# Patient Record
Sex: Male | Born: 1945
Health system: Southern US, Community
[De-identification: ages and names within clinical notes are randomized; demographics above are authoritative.]

## PROBLEM LIST (undated history)

## (undated) DIAGNOSIS — R001 Bradycardia, unspecified: Secondary | ICD-10-CM

## (undated) DIAGNOSIS — IMO0002 Reserved for concepts with insufficient information to code with codable children: Secondary | ICD-10-CM

## (undated) DIAGNOSIS — E785 Hyperlipidemia, unspecified: Secondary | ICD-10-CM

## (undated) DIAGNOSIS — Z951 Presence of aortocoronary bypass graft: Secondary | ICD-10-CM

## (undated) DIAGNOSIS — I959 Hypotension, unspecified: Secondary | ICD-10-CM

## (undated) DIAGNOSIS — R7989 Other specified abnormal findings of blood chemistry: Secondary | ICD-10-CM

## (undated) DIAGNOSIS — I2510 Atherosclerotic heart disease of native coronary artery without angina pectoris: Secondary | ICD-10-CM

## (undated) HISTORY — DX: Other specified abnormal findings of blood chemistry: R79.89

## (undated) HISTORY — DX: Reserved for concepts with insufficient information to code with codable children: IMO0002

## (undated) HISTORY — PX: TESTICLE SURGERY: SHX794

## (undated) HISTORY — DX: Hypotension, unspecified: I95.9

## (undated) HISTORY — DX: Bradycardia, unspecified: R00.1

## (undated) HISTORY — DX: Presence of aortocoronary bypass graft: Z95.1

## (undated) HISTORY — DX: Hyperlipidemia, unspecified: E78.5

## (undated) HISTORY — DX: Atherosclerotic heart disease of native coronary artery without angina pectoris: I25.10

---

## 2001-11-20 ENCOUNTER — Ambulatory Visit (HOSPITAL_BASED_OUTPATIENT_CLINIC_OR_DEPARTMENT_OTHER): Admission: RE | Admit: 2001-11-20 | Discharge: 2001-11-20 | Payer: Self-pay | Admitting: Urology

## 2006-01-20 ENCOUNTER — Inpatient Hospital Stay (HOSPITAL_COMMUNITY): Admission: AD | Admit: 2006-01-20 | Discharge: 2006-01-29 | Payer: Self-pay | Admitting: Cardiology

## 2006-01-20 HISTORY — PX: CARDIAC CATHETERIZATION: SHX172

## 2006-01-24 DIAGNOSIS — Z951 Presence of aortocoronary bypass graft: Secondary | ICD-10-CM

## 2006-01-24 HISTORY — DX: Presence of aortocoronary bypass graft: Z95.1

## 2006-01-24 HISTORY — PX: CORONARY ARTERY BYPASS GRAFT: SHX141

## 2006-02-22 ENCOUNTER — Encounter: Admission: RE | Admit: 2006-02-22 | Discharge: 2006-02-22 | Payer: Self-pay | Admitting: Surgery

## 2006-10-21 IMAGING — CR DG CHEST 1V PORT
1 series · 1 of 1 positions shown · non-contrast
Comparison: 01/25/06.

CLINICAL DATA: Coronary artery disease. 
PORTABLE CHEST - 1 VIEW:

[view not recorded]
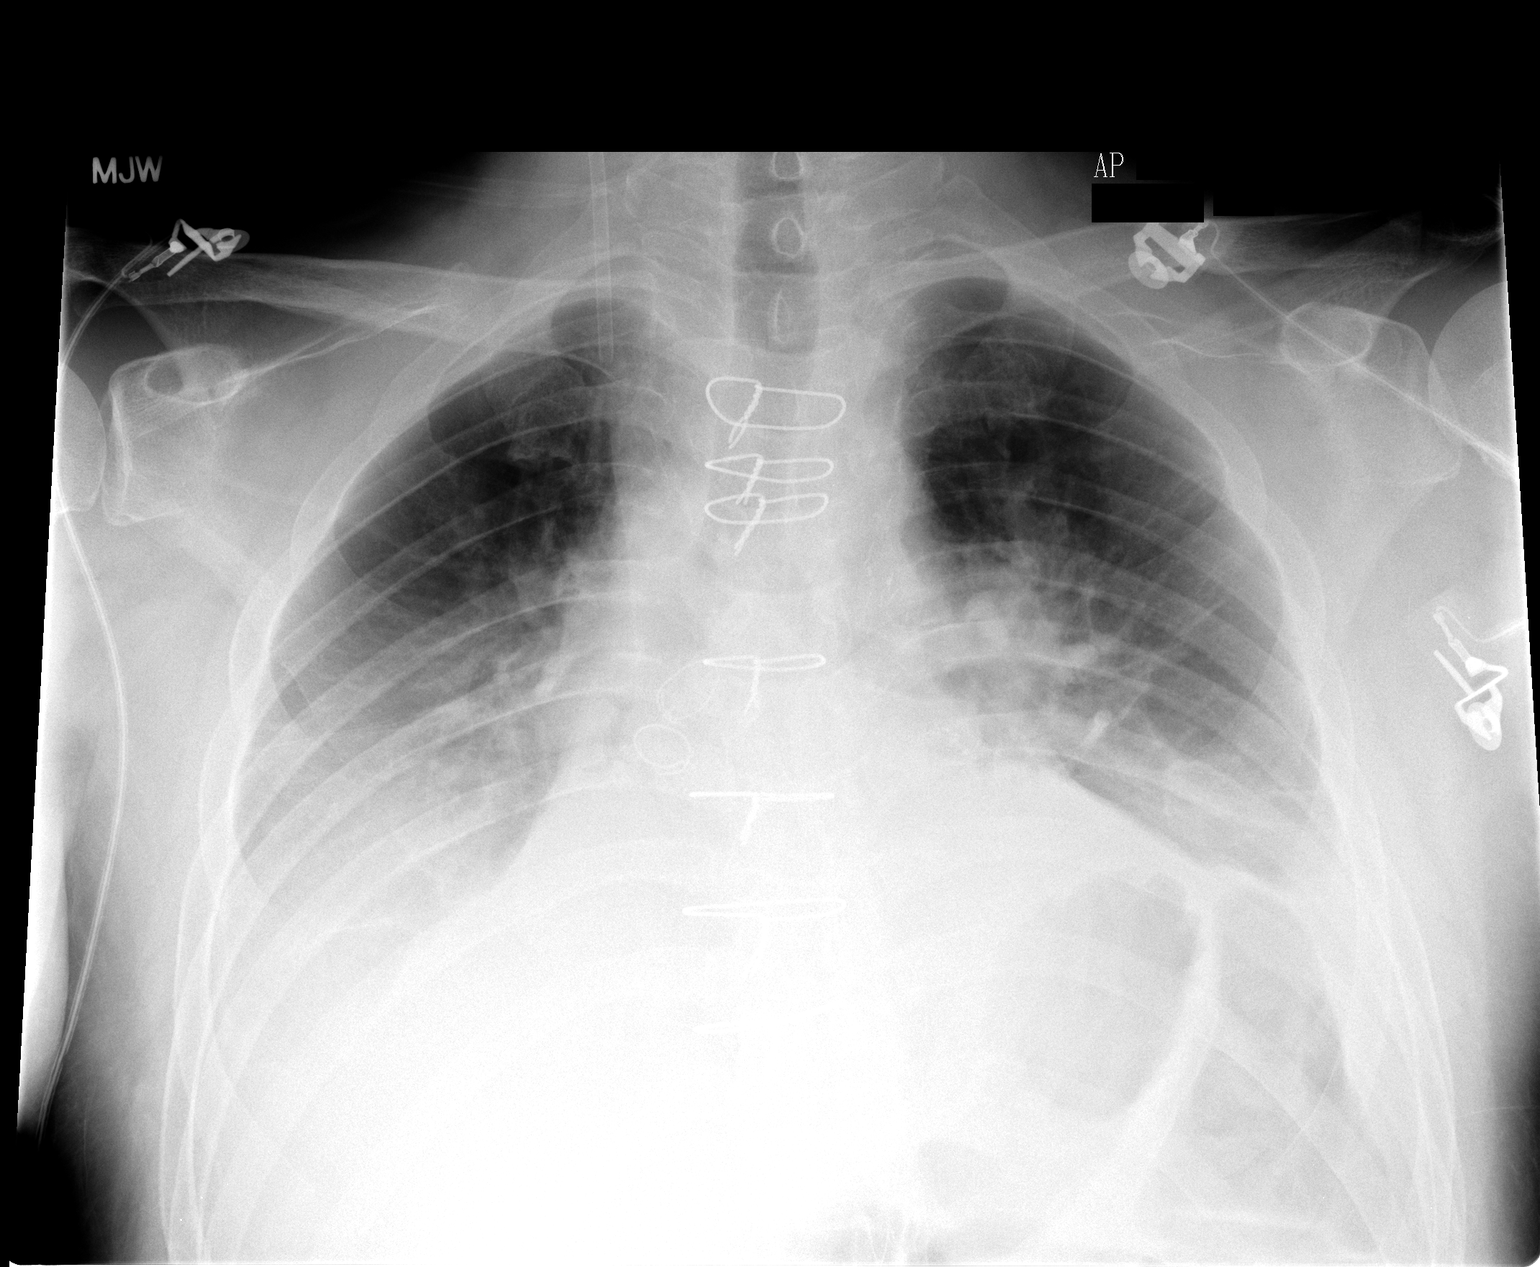

[1 of 1 positions shown; findings below may reference images not displayed]

FINDINGS: Nasogastric tube has been removed.  A cordis sheath from a right IJ approach remains in place.  Median sternotomy changes.  Mediastinal drain has been removed.  Left sided chest tube has also been removed.  Heart is mildly enlarged.  Pulmonary venous congestion is similar.  Bibasilar atelectasis and small right greater than left pleural effusions are unchanged.  No pneumothorax. Mildly prominent loop of splenic flexure of colon incidentally identified.
IMPRESSION: 1.  Interval removal of left sided chest tube without evidence of pneumothorax. 
2.  pulmonary venous congestion and bibasilar atelectasis.

## 2013-01-21 ENCOUNTER — Encounter: Payer: Self-pay | Admitting: *Deleted

## 2013-02-19 ENCOUNTER — Encounter: Payer: Self-pay | Admitting: Cardiovascular Disease

## 2013-11-20 ENCOUNTER — Encounter: Payer: Self-pay | Admitting: Cardiovascular Disease

## 2014-01-01 ENCOUNTER — Encounter: Payer: Self-pay | Admitting: Cardiovascular Disease

## 2014-01-01 ENCOUNTER — Ambulatory Visit (INDEPENDENT_AMBULATORY_CARE_PROVIDER_SITE_OTHER): Payer: Medicare Other | Admitting: Cardiovascular Disease

## 2014-01-01 VITALS — BP 118/76 | HR 58 | Resp 20 | Ht 68.5 in | Wt 195.7 lb

## 2014-01-01 DIAGNOSIS — R062 Wheezing: Secondary | ICD-10-CM

## 2014-01-01 DIAGNOSIS — E785 Hyperlipidemia, unspecified: Secondary | ICD-10-CM

## 2014-01-01 DIAGNOSIS — I2510 Atherosclerotic heart disease of native coronary artery without angina pectoris: Secondary | ICD-10-CM

## 2014-01-01 MED ORDER — NITROGLYCERIN 0.4 MG SL SUBL: 0.4000 mg | SUBLINGUAL_TABLET | SUBLINGUAL | Status: DC | PRN

## 2014-01-01 NOTE — Patient Instructions (Signed)
STOP Metoprolol.  Your physician recommends that you schedule a follow-up appointment in: ONE YEAR.

## 2014-01-12 ENCOUNTER — Encounter: Payer: Self-pay | Admitting: Cardiovascular Disease

## 2014-01-12 DIAGNOSIS — I2510 Atherosclerotic heart disease of native coronary artery without angina pectoris: Secondary | ICD-10-CM | POA: Insufficient documentation

## 2014-01-12 DIAGNOSIS — R062 Wheezing: Secondary | ICD-10-CM | POA: Insufficient documentation

## 2014-01-12 DIAGNOSIS — E785 Hyperlipidemia, unspecified: Secondary | ICD-10-CM | POA: Insufficient documentation

## 2014-01-12 NOTE — Progress Notes (Signed)
Patient ID: Stephen Morales, male   DOB: Sep 12, 1946, 68 y.o.   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 He presented with exertional angina and has not had recurrence of symptoms since surgery. He exercises several days a week on the elliptical and using weights and has no complaints of dyspnea or chest pain. On the other hand he has noticed wheezing during sexual intercourse. He denies any other complaints. He had a normal stress test in 2010 when he was able to exercise for a total of 12 minutes and has always had preserved left ventricular systolic function.   Allergies  Allergen Reactions  . Morphine And Related   . Oxycontin [Oxycodone Hcl]   . Zocor [Simvastatin]     myalgias    Current Outpatient Prescriptions  Medication Sig Dispense Refill  . aspirin 81 MG tablet Take 81 mg by mouth daily.      [name] atorvastatin (LIPITOR) 40 MG tablet Take 40 mg by mouth daily.      . cetirizine (ZYRTEC) 10 MG tablet Take 10 mg by mouth daily.      . fluticasone (FLONASE) 50 MCG/ACT nasal spray Place 2 sprays into the nose daily.      . Magnesium 500 MG TABS Take 500 mg by mouth daily.      . nitroGLYCERIN (NITROSTAT) 0.4 MG SL tablet Place 1 tablet (0.4 mg total) under the tongue every 5 (five) minutes as needed for chest pain.  25 tablet  3   No current facility-administered medications for this visit.    Past Medical History  Diagnosis Date  . CAD (coronary artery disease)   . S/P CABG x 6 01/24/06  . Dyslipidemia   . Bradycardia   . Hypotension, unspecified     borderline  . Creatinine elevation     09/19/12 1.4    Past Surgical History  Procedure Laterality Date  . Coronary artery bypass graft  01/24/06    LIMA graft to LAD,seq SVG to the first and second obtuse marg. branches of the left CX, seq SVG to the post descending, first posterolateral and second posterolateral branches of the  RCA  . Testicle surgery    . Cardiac catheterization  01/20/06    severe 3 vessel disease    Family History  Problem Relation Age of Onset  . Hypertension Mother     History   Social History  . Marital Status: Married    Spouse Name: [name]    Number of Children: [name]  . Years of Education: [name]   Occupational History  . Not on file.   Social History Main Topics  . Smoking status: Never Smoker   . Smokeless tobacco: Not on file  . Alcohol Use: No  . Drug Use: No  . Sexual Activity: Not on file   Other Topics Concern  . Not on file   Social History Narrative  . No narrative on file    Review of systems: The patient specifically denies any chest pain at rest or with exertion, dyspnea at rest or with exertion, orthopnea, paroxysmal nocturnal dyspnea, syncope, palpitations, focal neurological deficits, intermittent claudication, lower extremity edema, unexplained weight gain, cough, hemoptysis or wheezing.  The patient also denies abdominal pain, nausea, vomiting, dysphagia, diarrhea, constipation, polyuria, polydipsia, dysuria, hematuria, frequency, urgency, abnormal bleeding or bruising, fever, chills, unexpected weight changes, mood swings, change in skin  or hair texture, change in voice quality, auditory or visual problems, allergic reactions or rashes, new musculoskeletal complaints other than usual "aches and pains".   PHYSICAL EXAM BP 118/76  Pulse 58  Resp 20  Ht 5' 8.5" (1.74 m)  Wt 88.769 kg (195 lb 11.2 oz)  BMI 29.32 kg/m2  General: Alert, oriented x3, no distress Head: no evidence of trauma, PERRL, EOMI, no exophtalmos or lid lag, no myxedema, no xanthelasma; normal ears, nose and oropharynx Neck: normal jugular venous pulsations and no hepatojugular reflux; brisk carotid pulses without delay and no carotid bruits Chest: clear to auscultation, no signs of consolidation by percussion or palpation, normal fremitus, symmetrical and full respiratory excursions,  sternotomy scar Cardiovascular: normal position and quality of the apical impulse, regular rhythm, normal first and second heart sounds, no murmurs, rubs or gallops Abdomen: no tenderness or distention, no masses by palpation, no abnormal pulsatility or arterial bruits, normal bowel sounds, no hepatosplenomegaly Extremities: no clubbing, cyanosis or edema; 2+ radial, ulnar and brachial pulses bilaterally; 2+ right femoral, posterior tibial and dorsalis pedis pulses; 2+ left femoral, posterior tibial and dorsalis pedis pulses; no subclavian or femoral bruits Neurological: grossly nonfocal   EKG: Sinus bradycardia otherwise normal  Lipid Panel December 2014 total cholesterol 130, triglycerides 44, HDL 47, LDL 74   BMET Normal electrolytes and liver function tests, creatinine 1.4, glucose 84   ASSESSMENT AND PLAN  Stephen Morales is generally doing quite well and his coronary risk factors are well addressed with the exception of being overweight. He has a tendency towards bradycardia and has developed wheezing.  Recommended that he stop his beta blocker which is of questionable benefit anyway in such a low dose. His lipid profile is good on the current dose of statin. I encouraged him to continue exercising and to try to loose some weight. He made a commitment to do so.  Orders Placed This Encounter  Procedures  . EKG 12-Lead   Meds ordered this encounter  Medications  . nitroGLYCERIN (NITROSTAT) 0.4 MG SL tablet    Sig: Place 1 tablet (0.4 mg total) under the tongue every 5 (five) minutes as needed for chest pain.    Dispense:  25 tablet    Refill:  3    Cree Morales  Stephen FairMihai Misti Towle, MD, [facility] [phone] office [phone] pager

## 2014-11-20 ENCOUNTER — Encounter: Payer: Self-pay | Admitting: Cardiovascular Disease

## 2015-01-03 ENCOUNTER — Ambulatory Visit: Payer: Medicare Other | Admitting: Cardiovascular Disease

## 2015-01-24 ENCOUNTER — Ambulatory Visit: Payer: Self-pay | Admitting: Cardiovascular Disease

## 2015-02-28 ENCOUNTER — Ambulatory Visit (INDEPENDENT_AMBULATORY_CARE_PROVIDER_SITE_OTHER): Payer: Medicare Other | Admitting: Cardiovascular Disease

## 2015-02-28 ENCOUNTER — Encounter: Payer: Self-pay | Admitting: Cardiovascular Disease

## 2015-02-28 VITALS — BP 106/82 | HR 58 | Resp 16 | Ht 68.5 in | Wt 187.3 lb

## 2015-02-28 DIAGNOSIS — I2510 Atherosclerotic heart disease of native coronary artery without angina pectoris: Secondary | ICD-10-CM | POA: Diagnosis not present

## 2015-02-28 DIAGNOSIS — E785 Hyperlipidemia, unspecified: Secondary | ICD-10-CM

## 2015-02-28 DIAGNOSIS — Z79899 Other long term (current) drug therapy: Secondary | ICD-10-CM | POA: Diagnosis not present

## 2015-02-28 NOTE — Progress Notes (Signed)
Patient ID: Stephen Morales, male   DOB: 1946/08/11, 69 y.o.   MRN: [id number]     Cardiology Office Note   Date:  02/28/2015   ID:  Stephen Morales, DOB August 03, 1946, MRN [id number]  PCP:  Pcp Not In System  Cardiologist:   Thurmon Fair, MD   Chief Complaint  Patient presents with  . Annual Exam      History of Present Illness: Stephen Morales is a 69 y.o. male who presents for CAD follow up.   Stephen Morales feels great and continues to exercise regularly. After we discontinued his beta blocker last year he has not had any further problems with wheezing.  He has mild bradycardia with a rate of 58 bpm at rest, even off the beta blocker. His LDL is very low at 49 and he inquires about a reduction in the toes have a statin.  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 He presented with exertional angina and has not had recurrence of symptoms since surgery. He exercises several days a week on the elliptical and using weights and has no complaints of dyspnea or chest pain. On the other hand he has noticed wheezing during sexual intercourse. He denies any other complaints. He had a normal stress test in 2010 when he was able to exercise for a total of 12 minutes and has always had preserved left ventricular systolic function.  Past Medical History  Diagnosis Date  . CAD (coronary artery disease)   . S/P CABG x 6 01/24/06  . Dyslipidemia   . Bradycardia   . Hypotension, unspecified     borderline  . Creatinine elevation     09/19/12 1.4    Past Surgical History  Procedure Laterality Date  . Coronary artery bypass graft  01/24/06    LIMA graft to LAD,seq SVG to the first and second obtuse marg. branches of the left CX, seq SVG to the post descending, first posterolateral and second posterolateral branches of the RCA  . Testicle surgery    . Cardiac catheterization  01/20/06    severe 3 vessel disease     Current Outpatient Prescriptions  Medication Sig  Dispense Refill  . aspirin 81 MG tablet Take 81 mg by mouth daily.    [name] atorvastatin (LIPITOR) 40 MG tablet Take 40 mg by mouth daily.    . B Complex-C (SUPER B COMPLEX PO) Take 1 capsule by mouth daily.    . cetirizine (ZYRTEC) 10 MG tablet Take 10 mg by mouth daily.    . Coenzyme Q10 (COQ10) 200 MG CAPS Take 1 capsule by mouth daily.    . cyanocobalamin 500 MCG tablet Take 500 mcg by mouth daily.    . fluticasone (FLONASE) 50 MCG/ACT nasal spray Place 2 sprays into the nose daily.    [name] ibuprofen (ADVIL,MOTRIN) 200 MG tablet Take 200 mg by mouth every 6 (six) hours as needed for moderate pain.    . niacin 500 MG tablet Take 500 mg by mouth at bedtime.    . nitroGLYCERIN (NITROSTAT) 0.4 MG SL tablet Place 1 tablet (0.4 mg total) under the tongue every 5 (five) minutes as needed for chest pain. 25 tablet 3  . Omega-3 Fatty Acids (FISH OIL) 1200 MG CAPS Take 2 capsules by mouth daily.     No current facility-administered medications for this visit.    Allergies:   Morphine and related; Oxycontin; and Zocor    Social History:  The patient  reports  that he has never smoked. He does not have any smokeless tobacco history on file. He reports that he does not drink alcohol or use illicit drugs.   Family History:  The patient's family history includes Hypertension in his mother.    ROS:  Please see the history of present illness.    Otherwise, review of systems positive for none.   All other systems are reviewed and negative.    PHYSICAL EXAM: VS:  BP 106/82 mmHg  Pulse 58  Resp 16  Ht 5' 8.5" (1.74 m)  Wt 187 lb 4.8 oz (84.959 kg)  BMI 28.06 kg/m2 , BMI Body mass index is 28.06 kg/(m^2).  General: Alert, oriented x3, no distress Head: no evidence of trauma, PERRL, EOMI, no exophtalmos or lid lag, no myxedema, no xanthelasma; normal ears, nose and oropharynx Neck: normal jugular venous pulsations and no hepatojugular reflux; brisk carotid pulses without delay and no carotid  bruits Chest: clear to auscultation, no signs of consolidation by percussion or palpation, normal fremitus, symmetrical and full respiratory excursions Cardiovascular: normal position and quality of the apical impulse, regular rhythm, normal first and second heart sounds, no murmurs, rubs or gallops Abdomen: no tenderness or distention, no masses by palpation, no abnormal pulsatility or arterial bruits, normal bowel sounds, no hepatosplenomegaly Extremities: no clubbing, cyanosis or edema; 2+ radial, ulnar and brachial pulses bilaterally; 2+ right femoral, posterior tibial and dorsalis pedis pulses; 2+ left femoral, posterior tibial and dorsalis pedis pulses; no subclavian or femoral bruits Neurological: grossly nonfocal Psych: euthymic mood, full affect   EKG:  EKG is ordered today. The ekg ordered today demonstrates  Sinus bradycardia otherwise normal   Recent Labs:  11/11/2014 Creatinine 1.4 (previously 1.5), BUN 13 , glucose 89, normal liver function tests    Lipid Panel  November 11 2014   total cholesterol 123, triglycerides 65, HDL 61, LDL 49    Wt Readings from Last 3 Encounters:  02/28/15 187 lb 4.8 oz (84.959 kg)  01/01/14 195 lb 11.2 oz (88.769 kg)     ASSESSMENT AND PLAN:   Stephen SeniorStephen has no symptoms of recurrent coronary insufficiency 9 years after bypass surgery.  He is very active and fit. He has lost 8 pounds since last year. He remains overweight, but is also quite muscular , especially when taking into account his age. His lipid profile is excellent. I think it is reasonable to reduce the atorvastatin to 20 mg daily.  Repeat lipids 3 months later.   Current medicines are reviewed at length with the patient today.  The patient does not have concerns regarding medicines.  The following changes have been made:   lower atorvastatin to 20 mg daily  Labs/ tests ordered today include:  No orders of the defined types were placed in this encounter.   Patient Instructions   DECREASE Lipitor to 20mg  daily.  Your physician recommends that you return for lab work in: 3 months - 1st week of August FASTING.  Dr. Royann Shiversroitoru recommends that you schedule a follow-up appointment in: One year.       Joie BimlerSigned, Dorothea Yow, MD  02/28/2015 9:21 AM    Thurmon FairMihai Kaynen Minner, MD, [facility] [phone] office [phone] pager

## 2015-02-28 NOTE — Patient Instructions (Addendum)
DECREASE Lipitor to 20mg  daily.  Your physician recommends that you return for lab work in: 3 months - 1st week of August FASTING.  Dr. Royann Shiversroitoru recommends that you schedule a follow-up appointment in: One year.

## 2015-05-28 ENCOUNTER — Encounter: Payer: Self-pay | Admitting: Cardiovascular Disease

## 2015-05-30 ENCOUNTER — Encounter: Payer: Self-pay | Admitting: Cardiovascular Disease

## 2015-07-25 ENCOUNTER — Telehealth: Payer: Self-pay | Admitting: Cardiovascular Disease

## 2015-07-25 NOTE — Telephone Encounter (Signed)
Pt had his second blood work done in July and sent to Dr C. He have not heard anything from Dr Alcide Goodness was supposed to let him know what to do about his cholesterol medicine. Does he need to continue taking half or if he needs more?

## 2015-07-25 NOTE — Telephone Encounter (Signed)
Called and let him know to continue with current dose of cholesterol medication and that lab values are exactly where they need to be

## 2015-07-25 NOTE — Telephone Encounter (Signed)
He does need to continue the current dose of cholesterol medicine. Lab values are exactly where we want them

## 2015-07-25 NOTE — Telephone Encounter (Addendum)
Patient called  Pt had his second blood work done in July and sent to Dr C. He have not heard anything from Dr Alcide Goodness was supposed to let him know what to do about his cholesterol medicine. Does he need to continue taking half or if he needs more?   Lab results scanned for 05/2015  Cholesterol   127 Triglycerides  68 HDL Cholesterol 60 VLDL Cholesterol Cal  14  LDLCholesterol Calc  53 LDL/HDL Ratio 0.9  Reviewed with patient  Routed to Dr. Royann Shivers  Patient wants to know if he needs to continue his cholesterol RX.  He does not mind continuing if he needs to

## 2015-11-19 ENCOUNTER — Telehealth: Payer: Self-pay | Admitting: Cardiovascular Disease

## 2015-11-19 NOTE — Telephone Encounter (Signed)
Patient also stated he needs to know what he has paid our practice this year for his taxes.Advised I will send message to patient accounts.

## 2015-11-19 NOTE — Telephone Encounter (Signed)
Stephen Morales is calling because Dr.Brindle office is needing a request from Dr. Royann Shivers  For them to do his lab work , Please fax over the order to [phone].// Phone # [phone] option 5  Thanks

## 2015-11-19 NOTE — Telephone Encounter (Signed)
Returned call to patient.Advised Dr.Croitoru wants you to have Lipid Panel done for dyslipidemia.Note faxed to PCP Dr.Brindle at fax # [phone].

## 2016-01-01 ENCOUNTER — Ambulatory Visit (INDEPENDENT_AMBULATORY_CARE_PROVIDER_SITE_OTHER): Payer: Medicare Other | Admitting: Cardiovascular Disease

## 2016-01-01 ENCOUNTER — Encounter: Payer: Self-pay | Admitting: Cardiovascular Disease

## 2016-01-01 VITALS — BP 114/76 | HR 60 | Ht 68.5 in | Wt 180.2 lb

## 2016-01-01 DIAGNOSIS — I2510 Atherosclerotic heart disease of native coronary artery without angina pectoris: Secondary | ICD-10-CM | POA: Diagnosis not present

## 2016-01-01 DIAGNOSIS — E785 Hyperlipidemia, unspecified: Secondary | ICD-10-CM | POA: Diagnosis not present

## 2016-01-01 NOTE — Progress Notes (Signed)
Patient ID: Stephen Morales, male   DOB: 28-Jan-1946, 70 y.o.   MRN: [id number]    Cardiology Office Note    Date:  01/01/2016   ID:  Stephen Morales, DOB 11/08/1945, MRN [id number]  PCP:  Pcp Not In System  Cardiologist:   Thurmon Fair, MD   Chief Complaint  Patient presents with  . Annual Exam     no chest pain, no shortness of breath, no edema, has pain & cramping in legs, no lightheadedness or dizziness, no fatigue    History of Present Illness:  Stephen Morales is a 70 y.o. male who is soon to celebrate the 10th anniversary from his bypass surgery. He is in excellent physical shape and is exercising regularly at the gym. He has recently moved back to [place] and carried also the furniture and boxes himself, up and down stairs, without any shortness of breath or exertional angina. He denies dizziness or syncope. He has not had leg edema, claudication or wheezing. 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  He is very careful with his diet. He has lost 7 pounds since his appointment last April and has lost more than 15 pounds in the last 2 years. His body mass index still puts him in the overweight category but he is actually remarkably muscular for his age and I think his BMI may be overstating the amount of extra weight that he carries.  His recent lipid profile was excellent, even after we decreased his dose of statin. Total cholesterol 123, HDL 59, LDL 53, triglycerides 53. His other laboratory tests were also normal, including a creatinine of 1.2.  His last treadmill stress test was 5 years ago in 2010 when he exercised for 12 minutes. His presenting angina was a sensation of electrical current across his precordium and numbness in his left forearm. He has not experienced this since bypass surgery and he has occasional atypical chest twinges that are not related to exercise.  Past Medical History  Diagnosis Date  . CAD (coronary  artery disease)   . S/P CABG x 6 01/24/06  . Dyslipidemia   . Bradycardia   . Hypotension, unspecified     borderline  . Creatinine elevation     09/19/12 1.4    Past Surgical History  Procedure Laterality Date  . Coronary artery bypass graft  01/24/06    LIMA graft to LAD,seq SVG to the first and second obtuse marg. branches of the left CX, seq SVG to the post descending, first posterolateral and second posterolateral branches of the RCA  . Testicle surgery    . Cardiac catheterization  01/20/06    severe 3 vessel disease    Outpatient Prescriptions Prior to Visit  Medication Sig Dispense Refill  . aspirin 81 MG tablet Take 81 mg by mouth daily.    . B Complex-C (SUPER B COMPLEX PO) Take 1 capsule by mouth daily.    . cetirizine (ZYRTEC) 10 MG tablet Take 10 mg by mouth daily.    . Coenzyme Q10 (COQ10) 200 MG CAPS Take 1 capsule by mouth daily.    . cyanocobalamin 500 MCG tablet Take 500 mcg by mouth daily.    . fluticasone (FLONASE) 50 MCG/ACT nasal spray Place 2 sprays into the nose daily.    [name] ibuprofen (ADVIL,MOTRIN) 200 MG tablet Take 200 mg by mouth every 6 (six) hours as needed for moderate pain.    . Omega-3 Fatty Acids (FISH  OIL) 1200 MG CAPS Take 2 capsules by mouth daily.    . niacin 500 MG tablet Take 500 mg by mouth at bedtime.    . nitroGLYCERIN (NITROSTAT) 0.4 MG SL tablet Place 1 tablet (0.4 mg total) under the tongue every 5 (five) minutes as needed for chest pain. 25 tablet 3  . atorvastatin (LIPITOR) 40 MG tablet Take 20 mg by mouth daily. Reported on 01/01/2016     No facility-administered medications prior to visit.     Allergies:   Morphine and related; Oxycontin; and Zocor   Social History   Social History  . Marital Status: Married    Spouse Name: [name]  . Number of Children: [name]  . Years of Education: [name]   Social History Main Topics  . Smoking status: Never Smoker   . Smokeless tobacco: None  . Alcohol Use: No  . Drug Use: No  . Sexual Activity:  Not Asked   Other Topics Concern  . None   Social History Narrative     Family History:  The patient's family history includes Hypertension in his mother.   ROS:   Please see the history of present illness.    ROS All other systems reviewed and are negative.   PHYSICAL EXAM:   VS:  BP 114/76 mmHg  Pulse 60  Ht 5' 8.5" (1.74 m)  Wt 81.733 kg (180 lb 3 oz)  BMI 27.00 kg/m2   GEN: Well nourished, well developed, in no acute distress HEENT: normal Neck: no JVD, carotid bruits, or masses Cardiac: RRR; no murmurs, rubs, or gallops,no edema  Respiratory:  clear to auscultation bilaterally, normal work of breathing GI: soft, nontender, nondistended, + BS MS: no deformity or atrophy Skin: warm and dry, no rash Neuro:  Alert and Oriented x 3, Strength and sensation are intact Psych: euthymic mood, full affect  Wt Readings from Last 3 Encounters:  01/01/16 81.733 kg (180 lb 3 oz)  02/28/15 84.959 kg (187 lb 4.8 oz)  01/01/14 88.769 kg (195 lb 11.2 oz)      Studies/Labs Reviewed:   EKG:  EKG is ordered today.  The ekg ordered today demonstrates normal sinus rhythm, possible left atrial abnormality, no repolarization changes  Recent Labs: see scanned labs. Total cholesterol 123, HDL 59, LDL 53, trig with sweats 53, creatinine 1.2 normal chemistries including liver function tests    ASSESSMENT:    1. Coronary artery disease involving native coronary artery of native heart without angina pectoris   2. Hyperlipidemia      PLAN:  In order of problems listed above:  1. He has no symptoms of active coronary insufficiency. Even though 10 years have passed since his bypass procedure, his risk factors are all very well controlled. He does not have exertional angina and I don't think there is any reason to keep refilling his nitroglycerin. We will go ahead with a plain treadmill stress test to compare his current exercise capacity with his results from 2010. 2. His lipid profile  is excellent. I see no purpose in continuing to take niacin and I recommended that he should stop it. Target LDL cholesterol less than 70, he is well within that range.    Medication Adjustments/Labs and Tests Ordered: Current medicines are reviewed at length with the patient today.  Concerns regarding medicines are outlined above.  Medication changes, Labs and Tests ordered today are listed in the Patient Instructions below. Patient Instructions  Your physician has requested that you have an exercise tolerance  test. For further information please visit [email or web address] Please also follow instruction sheet, as given.  Your physician has recommended you make the following change in your medication: STOP NIACIN AND NTG  Dr. Royann Shivers recommends that you schedule a follow-up appointment in: ONE YEAR         SignedThurmon Fair, MD  01/01/2016 5:54 PM    [facility] [address] [place], [place], [place]  [address] Phone: [phone]; Fax: [phone]

## 2016-01-01 NOTE — Patient Instructions (Signed)
Your physician has requested that you have an exercise tolerance test. For further information please visit [email or web address] Please also follow instruction sheet, as given.  Your physician has recommended you make the following change in your medication: STOP NIACIN AND NTG  Dr. Royann Shivers recommends that you schedule a follow-up appointment in: ONE YEAR

## 2016-01-05 ENCOUNTER — Encounter: Payer: Self-pay | Admitting: Cardiovascular Disease

## 2016-01-21 ENCOUNTER — Telehealth: Payer: Self-pay | Admitting: Cardiovascular Disease

## 2016-01-21 NOTE — Telephone Encounter (Signed)
°*  STAT* If patient is at the pharmacy, call can be transferred to refill team.   1. Which medications need to be refilled? (please list name of each medication and dose if known) Fluticasone  2. Which pharmacy/location (including street and city if local pharmacy) is medication to be sent to?[facility]  3. Do they need a 30 day or 90 day supply? 90

## 2016-01-21 NOTE — Telephone Encounter (Signed)
Spoke with pt, he will contact his PCP.

## 2016-02-11 ENCOUNTER — Encounter (HOSPITAL_COMMUNITY): Payer: Medicare Other

## 2016-03-24 ENCOUNTER — Other Ambulatory Visit: Payer: Self-pay | Admitting: *Deleted

## 2016-03-24 MED ORDER — ATORVASTATIN CALCIUM 20 MG PO TABS: 20.0000 mg | ORAL_TABLET | Freq: Every day | ORAL | Status: DC

## 2016-12-13 ENCOUNTER — Telehealth: Payer: Self-pay | Admitting: Cardiovascular Disease

## 2016-12-13 NOTE — Telephone Encounter (Signed)
Message routed to C. Truitt, CMA & medical records

## 2016-12-13 NOTE — Telephone Encounter (Signed)
New message    Pt calling to see if blood work records were received [facility]

## 2017-01-06 ENCOUNTER — Other Ambulatory Visit: Payer: Self-pay | Admitting: *Deleted

## 2017-01-06 MED ORDER — ATORVASTATIN CALCIUM 20 MG PO TABS: 20.0000 mg | ORAL_TABLET | Freq: Every day | ORAL | 0 refills | Status: DC

## 2017-01-06 NOTE — Telephone Encounter (Signed)
REFILL 

## 2017-02-10 ENCOUNTER — Encounter: Payer: Self-pay | Admitting: Cardiovascular Disease

## 2017-02-10 ENCOUNTER — Ambulatory Visit (INDEPENDENT_AMBULATORY_CARE_PROVIDER_SITE_OTHER): Payer: PPO | Admitting: Cardiovascular Disease

## 2017-02-10 VITALS — BP 115/74 | HR 60 | Ht 68.0 in | Wt 181.0 lb

## 2017-02-10 DIAGNOSIS — I2510 Atherosclerotic heart disease of native coronary artery without angina pectoris: Secondary | ICD-10-CM | POA: Diagnosis not present

## 2017-02-10 DIAGNOSIS — E7800 Pure hypercholesterolemia, unspecified: Secondary | ICD-10-CM | POA: Diagnosis not present

## 2017-02-10 MED ORDER — ATORVASTATIN CALCIUM 20 MG PO TABS: 20.0000 mg | ORAL_TABLET | Freq: Every day | ORAL | 3 refills | Status: DC

## 2017-02-10 NOTE — Progress Notes (Signed)
Patient ID: Stephen Morales, male   DOB: 12/01/1945, 71 y.o.   MRN: [id number]    Cardiology Office Note    Date:  02/10/2017   ID:  Stephen Morales, DOB September 20, 1946, MRN [id number]  PCP:  Redmond Baseman, MD  Cardiologist:   Thurmon Fair, MD   Chief Complaint  Patient presents with  . Follow-up    History of Present Illness:  Stephen Morales is a 72 y.o. male now more than 10 years from his bypass surgery. He is in excellent physical shape and is exercising regularly at the gym, 4-6 days a week.[name] He denies dizziness or syncope. He has not had leg edema, claudication or wheezing. 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 He is very careful with his diet.He is actually remarkably muscular for his age.  His recent lipid profile was excellent, even after we decreased his dose of statin. October 12 2016:  Total cholesterol 130, HDL 55, LDL 59, triglycerides 79. His other laboratory tests were also normal, including a creatinine of 1.32, glucose 86  His last treadmill stress test was 5 years ago in 2010 when he exercised for 12 minutes. His presenting angina was a sensation of electrical current across his precordium and numbness in his left forearm. He has not experienced this since bypass surgery and he has occasional atypical chest twinges that are not related to exercise.  Past Medical History:  Diagnosis Date  . Bradycardia   . CAD (coronary artery disease)   . Creatinine elevation    09/19/12 1.4  . Dyslipidemia   . Hypotension, unspecified    borderline  . S/P CABG x 6 01/24/06    Past Surgical History:  Procedure Laterality Date  . CARDIAC CATHETERIZATION  01/20/06   severe 3 vessel disease  . CORONARY ARTERY BYPASS GRAFT  01/24/06   LIMA graft to LAD,seq SVG to the first and second obtuse marg. branches of the left CX, seq SVG to the post descending, first posterolateral and second posterolateral branches of the RCA  .  TESTICLE SURGERY      Outpatient Medications Prior to Visit  Medication Sig Dispense Refill  . aspirin 81 MG tablet Take 81 mg by mouth daily.    . B Complex-C (SUPER B COMPLEX PO) Take 1 capsule by mouth daily.    . cetirizine (ZYRTEC) 10 MG tablet Take 10 mg by mouth daily.    . Coenzyme Q10 (COQ10) 200 MG CAPS Take 1 capsule by mouth daily.    . cyanocobalamin 500 MCG tablet Take 500 mcg by mouth daily.    . fluticasone (FLONASE) 50 MCG/ACT nasal spray Place 2 sprays into the nose daily.    [name] ibuprofen (ADVIL,MOTRIN) 200 MG tablet Take 200 mg by mouth every 6 (six) hours as needed for moderate pain.    . Omega-3 Fatty Acids (FISH OIL) 1200 MG CAPS Take 2 capsules by mouth daily.    [name] atorvastatin (LIPITOR) 20 MG tablet Take 1 tablet (20 mg total) by mouth daily. KEEP OV. 90 tablet 0   No facility-administered medications prior to visit.      Allergies:   Morphine and related; Oxycontin [oxycodone hcl]; and Zocor [simvastatin]   Social History   Social History  . Marital status: Married    Spouse name: [name]  . Number of children: [name]  . Years of education: [name]   Social History Main Topics  . Smoking status: Never Smoker  .  Smokeless tobacco: Not on file  . Alcohol use No  . Drug use: No  . Sexual activity: Not on file   Other Topics Concern  . Not on file   Social History Narrative  . No narrative on file     Family History:  The patient's family history includes Hypertension in his mother.   ROS:   Please see the history of present illness.    ROS All other systems reviewed and are negative.   PHYSICAL EXAM:   VS:  BP 115/74 (BP Location: Right Arm)   Pulse 60   Ht  (1.727 m)   Wt 82.1 kg (181 lb)   BMI 27.52 kg/m    Recheck BP 118/74 mmHg GEN: Well nourished, well developed, in no acute distress  HEENT: normal  Neck: no JVD, carotid bruits, or masses Cardiac: RRR; no murmurs, rubs, or gallops,no edema  Respiratory:  clear to auscultation  bilaterally, normal work of breathing GI: soft, nontender, nondistended, + BS MS: no deformity or atrophy  Skin: warm and dry, no rash Neuro:  Alert and Oriented x 3, Strength and sensation are intact Psych: euthymic mood, full affect  Wt Readings from Last 3 Encounters:  02/10/17 82.1 kg (181 lb)  01/01/16 81.7 kg (180 lb 3 oz)  02/28/15 85 kg (187 lb 4.8 oz)      Studies/Labs Reviewed:   EKG:  EKG is ordered today.  The ekg ordered today demonstrates normal sinus rhythm, possible left atrial abnormality, no repolarization changes. QTc 412 ms  Recent Labs: October 12 2016:  Total cholesterol 130, HDL 55, LDL 59, triglycerides 79. His other laboratory tests were also normal, including a creatinine of 1.32, glucose 86     ASSESSMENT:    1. Coronary artery disease involving native coronary artery of native heart without angina pectoris   2. Pure hypercholesterolemia      PLAN:  In order of problems listed above:  1. CAD: no angina despite very active lifestyle. He has no symptoms of active coronary insufficiency. Even though 10 years have passed since his bypass procedure, his risk factors are all very well controlled. 2. HLP: He had some misgivings about the possible side effects of statins, but I told him that in his particular case the risks are greatly outweighed by the benefits. Target LDL cholesterol less than 70, he is well within that range.    Medication Adjustments/Labs and Tests Ordered: Current medicines are reviewed at length with the patient today.  Concerns regarding medicines are outlined above.  Medication changes, Labs and Tests ordered today are listed in the Patient Instructions below. Patient Instructions  Dr Royann Shivers recommends that you schedule a follow-up appointment in 11 months. You will receive a reminder letter in the mail two months in advance. If you don't receive a letter, please call our office to schedule the follow-up appointment.  If you  need a refill on your cardiac medications before your next appointment, please call your pharmacy.      Signed, Thurmon Fair, MD  02/10/2017 2:04 PM    [facility] [address] [place], [place], [place]  [address] Phone: [phone]; Fax: [phone]

## 2017-02-10 NOTE — Patient Instructions (Signed)
Dr Royann Shivers recommends that you schedule a follow-up appointment in 11 months. You will receive a reminder letter in the mail two months in advance. If you don't receive a letter, please call our office to schedule the follow-up appointment.  If you need a refill on your cardiac medications before your next appointment, please call your pharmacy.

## 2017-03-17 ENCOUNTER — Other Ambulatory Visit: Payer: Self-pay | Admitting: Cardiovascular Disease

## 2017-03-17 NOTE — Telephone Encounter (Signed)
New message  Please emphasis 90 days - pharmacy said they can only do 30  *STAT* If patient is at the pharmacy, call can be transferred to refill team.   1. Which medications need to be refilled? (please list name of each medication and dose if known)   atorvastatin (LIPITOR) 20 MG tablet Take 1 tablet (20 mg total) by mouth daily.     2. Which pharmacy/location (including street and city if local pharmacy) is medication to be sent to? [facility] on [address]    3. Do they need a 30 day or 90 day supply? 90

## 2017-03-22 ENCOUNTER — Other Ambulatory Visit: Payer: Self-pay | Admitting: *Deleted

## 2017-03-22 MED ORDER — ATORVASTATIN CALCIUM 20 MG PO TABS: 20.0000 mg | ORAL_TABLET | Freq: Every day | ORAL | 11 refills | Status: DC

## 2017-04-18 DIAGNOSIS — I25810 Atherosclerosis of coronary artery bypass graft(s) without angina pectoris: Secondary | ICD-10-CM | POA: Diagnosis not present

## 2017-04-18 DIAGNOSIS — E7800 Pure hypercholesterolemia, unspecified: Secondary | ICD-10-CM | POA: Diagnosis not present

## 2017-10-03 DIAGNOSIS — H5203 Hypermetropia, bilateral: Secondary | ICD-10-CM | POA: Diagnosis not present

## 2017-10-03 DIAGNOSIS — H2513 Age-related nuclear cataract, bilateral: Secondary | ICD-10-CM | POA: Diagnosis not present

## 2017-10-03 DIAGNOSIS — H04123 Dry eye syndrome of bilateral lacrimal glands: Secondary | ICD-10-CM | POA: Diagnosis not present

## 2017-11-04 DIAGNOSIS — Z125 Encounter for screening for malignant neoplasm of prostate: Secondary | ICD-10-CM | POA: Diagnosis not present

## 2017-11-04 DIAGNOSIS — Z0000 Encounter for general adult medical examination without abnormal findings: Secondary | ICD-10-CM | POA: Diagnosis not present

## 2017-11-04 DIAGNOSIS — Z1211 Encounter for screening for malignant neoplasm of colon: Secondary | ICD-10-CM | POA: Diagnosis not present

## 2017-11-04 DIAGNOSIS — Z86010 Personal history of colonic polyps: Secondary | ICD-10-CM | POA: Diagnosis not present

## 2017-11-04 DIAGNOSIS — E7800 Pure hypercholesterolemia, unspecified: Secondary | ICD-10-CM | POA: Diagnosis not present

## 2017-11-04 DIAGNOSIS — I25810 Atherosclerosis of coronary artery bypass graft(s) without angina pectoris: Secondary | ICD-10-CM | POA: Diagnosis not present

## 2017-11-14 DIAGNOSIS — Z1211 Encounter for screening for malignant neoplasm of colon: Secondary | ICD-10-CM | POA: Diagnosis not present

## 2018-01-23 ENCOUNTER — Ambulatory Visit (INDEPENDENT_AMBULATORY_CARE_PROVIDER_SITE_OTHER): Payer: PPO | Admitting: Cardiovascular Disease

## 2018-01-23 ENCOUNTER — Encounter: Payer: Self-pay | Admitting: Cardiovascular Disease

## 2018-01-23 VITALS — BP 112/78 | HR 58 | Ht 68.0 in | Wt 176.0 lb

## 2018-01-23 DIAGNOSIS — I2510 Atherosclerotic heart disease of native coronary artery without angina pectoris: Secondary | ICD-10-CM | POA: Diagnosis not present

## 2018-01-23 DIAGNOSIS — E7800 Pure hypercholesterolemia, unspecified: Secondary | ICD-10-CM

## 2018-01-23 NOTE — Progress Notes (Signed)
Patient ID: Stephen CollaSteven Morales, male   DOB: 11/27/1945, 72 y.o.   MRN: [id number]    Cardiology Office Note    Date:  01/24/2018   ID:  Stephen Morales, DOB 10/26/1946, MRN [id number]  PCP:  Ileana LaddWong, Francis P, MD  Cardiologist:   Thurmon FairMihai Viveka Wilmeth, MD   No chief complaint on file.   History of Present Illness:  Stephen Morales is a 72 y.o. male now 12 years from his bypass surgery.   Feels well and is in great physical shape.  3 miles in 45 minutes and then lifts weights 5 or 6 days a week every week.  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  The patient specifically denies any chest pain at rest exertion, dyspnea at rest or with exertion, orthopnea, paroxysmal nocturnal dyspnea, syncope, palpitations, focal neurological deficits, intermittent claudication, lower extremity edema, unexplained weight gain, cough, hemoptysis or wheezing.  Reports that his recent lipid profile with Dr. Modesto CharonWong was "excellent".  His last treadmill stress test was in 2010 when he exercised for 12 minutes. His presenting angina was a sensation of electrical current across his precordium and numbness in his left forearm. He has not experienced this since bypass surgery.  Past Medical History:  Diagnosis Date  . Bradycardia   . CAD (coronary artery disease)   . Creatinine elevation    09/19/12 1.4  . Dyslipidemia   . Hypotension, unspecified    borderline  . S/P CABG x 6 01/24/06    Past Surgical History:  Procedure Laterality Date  . CARDIAC CATHETERIZATION  01/20/06   severe 3 vessel disease  . CORONARY ARTERY BYPASS GRAFT  01/24/06   LIMA graft to LAD,seq SVG to the first and second obtuse marg. branches of the left CX, seq SVG to the post descending, first posterolateral and second posterolateral branches of the RCA  . TESTICLE SURGERY      Outpatient Medications Prior to Visit  Medication Sig Dispense Refill  . aspirin 81 MG tablet Take 81 mg by mouth daily.    [name]. atorvastatin (LIPITOR) 20 MG tablet Take 1 tablet (20 mg  total) by mouth daily. 30 tablet 11  . B Complex-C (SUPER B COMPLEX PO) Take 1 capsule by mouth daily.    . cetirizine (ZYRTEC) 10 MG tablet Take 10 mg by mouth daily.    . Coenzyme Q10 (COQ10) 200 MG CAPS Take 1 capsule by mouth daily.    . cyanocobalamin 500 MCG tablet Take 500 mcg by mouth daily.    . fluticasone (FLONASE) 50 MCG/ACT nasal spray Place 2 sprays into the nose daily.    [name]. ibuprofen (ADVIL,MOTRIN) 200 MG tablet Take 200 mg by mouth every 6 (six) hours as needed for moderate pain.    . Omega-3 Fatty Acids (FISH OIL) 1200 MG CAPS Take 2 capsules by mouth daily.     No facility-administered medications prior to visit.      Allergies:   Morphine and related; Oxycontin [oxycodone hcl]; and Zocor [simvastatin]   Social History   Socioeconomic History  . Marital status: Married    Spouse name: Not on file  . Number of children: Not on file  . Years of education: Not on file  . Highest education level: Not on file  Occupational History  . Not on file  Social Needs  . Financial resource strain: Not on file  . Food insecurity:    Worry: Not on file    Inability: Not on file  . Transportation needs:  Medical: Not on file    Non-medical: Not on file  Tobacco Use  . Smoking status: Never Smoker  . Smokeless tobacco: Never Used  Substance and Sexual Activity  . Alcohol use: No  . Drug use: No  . Sexual activity: Not on file  Lifestyle  . Physical activity:    Days per week: Not on file    Minutes per session: Not on file  . Stress: Not on file  Relationships  . Social connections:    Talks on phone: Not on file    Gets together: Not on file    Attends religious service: Not on file    Active member of club or organization: Not on file    Attends meetings of clubs or organizations: Not on file    Relationship status: Not on file  Other Topics Concern  . Not on file  Social History Narrative  . Not on file     Family History:  The patient's family history  includes Hypertension in his mother.   ROS:   Please see the history of present illness.    ROS All other systems reviewed and are negative.   PHYSICAL EXAM:   VS:  BP 112/78   Pulse (!) 58   Ht 5\' 8"  (1.727 m)   Wt 176 lb (79.8 kg)   BMI 26.76 kg/m    Recheck BP 118/74 mmHg  General: Alert, oriented x3, no distress, very fit and muscular for his age Head: no evidence of trauma, PERRL, EOMI, no exophtalmos or lid lag, no myxedema, no xanthelasma; normal ears, nose and oropharynx Neck: normal jugular venous pulsations and no hepatojugular reflux; brisk carotid pulses without delay and no carotid bruits Chest: clear to auscultation, no signs of consolidation by percussion or palpation, normal fremitus, symmetrical and full respiratory excursions Cardiovascular: normal position and quality of the apical impulse, regular rhythm, normal first and second heart sounds, no murmurs, rubs or gallops Abdomen: no tenderness or distention, no masses by palpation, no abnormal pulsatility or arterial bruits, normal bowel sounds, no hepatosplenomegaly Extremities: no clubbing, cyanosis or edema; 2+ radial, ulnar and brachial pulses bilaterally; 2+ right femoral, posterior tibial and dorsalis pedis pulses; 2+ left femoral, posterior tibial and dorsalis pedis pulses; no subclavian or femoral bruits Neurological: grossly nonfocal Psych: Normal mood and affect   Wt Readings from Last 3 Encounters:  01/23/18 176 lb (79.8 kg)  02/10/17 181 lb (82.1 kg)  01/01/16 180 lb 3 oz (81.7 kg)      Studies/Labs Reviewed:   EKG:  EKG is ordered today.  The ekg ordered today demonstrates normal sinus rhythm, possible left atrial abnormality, no repolarization changes. QTc 412 ms  Recent Labs: October 12 2016:  Total cholesterol 130, HDL 55, LDL 59, triglycerides 79. His other laboratory tests were also normal, including a creatinine of 1.32, glucose 86     ASSESSMENT:    1. Coronary artery disease  involving native coronary artery of native heart without angina pectoris   2. Hypercholesterolemia      PLAN:  In order of problems listed above:  1. CAD: Asymptomatic, angina free 12 years after bypass surgery, very physically active.  Risk factors are all well addressed. 2. HLP: He had some misgivings about the possible side effects of statins, but I told him that in his particular case the risks are greatly outweighed by the benefits. Target LDL cholesterol less than 70.  Discussed the difference between the use of aspirin and statins for  primary prevention (where the benefits are equivocal or small) versus secondary prevention (where the benefits are clear, as in his case).    Medication Adjustments/Labs and Tests Ordered: Current medicines are reviewed at length with the patient today.  Concerns regarding medicines are outlined above.  Medication changes, Labs and Tests ordered today are listed in the Patient Instructions below. Patient Instructions  Dr Royann Shivers recommends that you schedule a follow-up appointment in 12 months. You will receive a reminder letter in the mail two months in advance. If you don't receive a letter, please call our office to schedule the follow-up appointment.  If you need a refill on your cardiac medications before your next appointment, please call your pharmacy.      Signed, Thurmon Fair, MD  01/24/2018 1:54 PM    [facility] [address] [place], [place], [place]  [address] Phone: [phone]; Fax: [phone]

## 2018-01-23 NOTE — Patient Instructions (Signed)
Dr Croitoru recommends that you schedule a follow-up appointment in 12 months. You will receive a reminder letter in the mail two months in advance. If you don't receive a letter, please call our office to schedule the follow-up appointment.  If you need a refill on your cardiac medications before your next appointment, please call your pharmacy. 

## 2018-01-24 ENCOUNTER — Encounter: Payer: Self-pay | Admitting: Cardiovascular Disease

## 2018-02-09 ENCOUNTER — Ambulatory Visit: Payer: PPO | Admitting: Cardiovascular Disease

## 2018-02-16 NOTE — Addendum Note (Signed)
Addended by: Evans LanceSTOVER, Kathlynn Swofford W on: 02/16/2018 02:20 PM   Modules accepted: Orders

## 2018-03-08 ENCOUNTER — Other Ambulatory Visit: Payer: Self-pay | Admitting: Cardiovascular Disease

## 2018-03-08 NOTE — Telephone Encounter (Signed)
Rx(s) sent to pharmacy electronically.  

## 2018-05-11 DIAGNOSIS — E7800 Pure hypercholesterolemia, unspecified: Secondary | ICD-10-CM | POA: Diagnosis not present

## 2018-05-11 DIAGNOSIS — M25561 Pain in right knee: Secondary | ICD-10-CM | POA: Diagnosis not present

## 2018-05-11 DIAGNOSIS — Z86010 Personal history of colonic polyps: Secondary | ICD-10-CM | POA: Diagnosis not present

## 2018-05-11 DIAGNOSIS — I25810 Atherosclerosis of coronary artery bypass graft(s) without angina pectoris: Secondary | ICD-10-CM | POA: Diagnosis not present

## 2018-11-14 DIAGNOSIS — Z86010 Personal history of colonic polyps: Secondary | ICD-10-CM | POA: Diagnosis not present

## 2018-11-14 DIAGNOSIS — Z0000 Encounter for general adult medical examination without abnormal findings: Secondary | ICD-10-CM | POA: Diagnosis not present

## 2018-11-14 DIAGNOSIS — E7800 Pure hypercholesterolemia, unspecified: Secondary | ICD-10-CM | POA: Diagnosis not present

## 2018-11-14 DIAGNOSIS — I25810 Atherosclerosis of coronary artery bypass graft(s) without angina pectoris: Secondary | ICD-10-CM | POA: Diagnosis not present

## 2018-11-14 DIAGNOSIS — Z1211 Encounter for screening for malignant neoplasm of colon: Secondary | ICD-10-CM | POA: Diagnosis not present

## 2018-11-14 DIAGNOSIS — Z125 Encounter for screening for malignant neoplasm of prostate: Secondary | ICD-10-CM | POA: Diagnosis not present

## 2018-12-06 DIAGNOSIS — Z1211 Encounter for screening for malignant neoplasm of colon: Secondary | ICD-10-CM | POA: Diagnosis not present

## 2019-02-20 ENCOUNTER — Telehealth: Payer: Self-pay

## 2019-02-20 NOTE — Telephone Encounter (Signed)
Contacted patient to get verbal consent and wanted to know if I could contact his pcp and see if they could send [place] a copy of his most recent lab results.  Advised patient that I would contact Dr Nash Dimmer office to get lab results. Advised patient that I would contact him once I have more details. He voiced understanding.

## 2019-02-20 NOTE — Telephone Encounter (Signed)
Virtual Visit Pre-Appointment Phone Call  "Mr Stephen Morales, I am calling you today to discuss your upcoming appointment. We are currently trying to limit exposure to the virus that causes COVID-19 by seeing patients at home rather than in the office."  1. "What is the BEST phone number to call the day of the visit?" - include this in appointment notes  2. "Do you have or have access to (through a family member/friend) a smartphone with video capability that we can use for your visit?" a. If yes - list this number in appt notes as "cell" (if different from BEST phone #) and list the appointment type as a VIDEO visit in appointment notes b. If no - list the appointment type as a PHONE visit in appointment notes  3. Confirm consent - "In the setting of the current Covid19 crisis, you are scheduled for a VIDEO visit with your provider on 02/21/2019 at 8:40am.  Just as we do with many in-office visits, in order for you to participate in this visit, we must obtain consent.  If you'd like, I can send this to your mychart (if signed up) or email for you to review.  Otherwise, I can obtain your verbal consent now.  All virtual visits are billed to your insurance company just like a normal visit would be.  By agreeing to a virtual visit, we'd like you to understand that the technology does not allow for your provider to perform an examination, and thus may limit your provider's ability to fully assess your condition. If your provider identifies any concerns that need to be evaluated in person, we will make arrangements to do so.  Finally, though the technology is pretty good, we cannot assure that it will always work on either your or our end, and in the setting of a video visit, we may have to convert it to a phone-only visit.  In either situation, we cannot ensure that we have a secure connection.  Are you willing to proceed?" STAFF: Did the patient verbally acknowledge consent to telehealth visit? Document YES/NO  here: YES  4. Advise patient to be prepared - "Two hours prior to your appointment, go ahead and check your blood pressure, pulse, oxygen saturation, and your weight (if you have the equipment to check those) and write them all down. When your visit starts, your provider will ask you for this information. If you have an Apple Watch or Kardia device, please plan to have heart rate information ready on the day of your appointment. Please have a pen and paper handy nearby the day of the visit as well."  5. Give patient instructions for MyChart download to smartphone OR Doximity/Doxy.me as below if video visit (depending on what platform provider is using)  6. Inform patient they will receive a phone call 15 minutes prior to their appointment time (may be from unknown caller ID) so they should be prepared to answer    TELEPHONE CALL NOTE  Elham Wyles has been deemed a candidate for a follow-up tele-health visit to limit community exposure during the Covid-19 pandemic. I spoke with the patient via phone to ensure availability of phone/video source, confirm preferred email & phone number, and discuss instructions and expectations.  I reminded Jah Mcqueary to be prepared with any vital sign and/or heart rhythm information that could potentially be obtained via home monitoring, at the time of his visit. I reminded Jaydiel Holthaus to expect a phone call prior to his visit.  Cloma Rahrig,  Salomon Fickierica T, CMA 02/20/2019 5:01 PM   INSTRUCTIONS FOR DOWNLOADING THE MYCHART APP TO SMARTPHONE  - The patient must first make sure to have activated MyChart and know their login information - If Apple, go to [employer] Store and type in MyChart in the search bar and download the app. If Android, ask patient to go to [employer] Play Store and type in [facility] in the search bar and download the app. The app is free but as with any other app downloads, their phone may require them to verify saved payment information or Apple/Android password.   - The patient will need to then log into the app with their MyChart username and password, and select [facility] as their healthcare provider to link the account. When it is time for your visit, go to the MyChart app, find appointments, and click Begin Video Visit. Be sure to Select Allow for your device to access the Microphone and Camera for your visit. You will then be connected, and your provider will be with you shortly.  **If they have any issues connecting, or need assistance please contact MyChart service desk [phone])**  **If using a computer, in order to ensure the best quality for their visit they will need to use either of the following Internet Browsers: [employer] Edge, or Google Chrome**  IF USING DOXIMITY or DOXY.ME - The patient will receive a link just prior to their visit by text.     FULL LENGTH CONSENT FOR TELE-HEALTH VISIT   I hereby voluntarily request, consent and authorize [facility] and its employed or contracted physicians, physician assistants, nurse practitioners or other licensed health care professionals (the Practitioner), to provide me with telemedicine health care services (the "Services") as deemed necessary by the treating Practitioner. I acknowledge and consent to receive the Services by the Practitioner via telemedicine. I understand that the telemedicine visit will involve communicating with the Practitioner through live audiovisual communication technology and the disclosure of certain medical information by electronic transmission. I acknowledge that I have been given the opportunity to request an in-person assessment or other available alternative prior to the telemedicine visit and am voluntarily participating in the telemedicine visit.  I understand that I have the right to withhold or withdraw my consent to the use of telemedicine in the course of my care at any time, without affecting my right to future care or treatment, and that  the Practitioner or I may terminate the telemedicine visit at any time. I understand that I have the right to inspect all information obtained and/or recorded in the course of the telemedicine visit and may receive copies of available information for a reasonable fee.  I understand that some of the potential risks of receiving the Services via telemedicine include:  [name]. Delay or interruption in medical evaluation due to technological equipment failure or disruption; . Information transmitted may not be sufficient (e.g. poor resolution of images) to allow for appropriate medical decision making by the Practitioner; and/or  . In rare instances, security protocols could fail, causing a breach of personal health information.  Furthermore, I acknowledge that it is my responsibility to provide information about my medical history, conditions and care that is complete and accurate to the best of my ability. I acknowledge that Practitioner's advice, recommendations, and/or decision may be based on factors not within their control, such as incomplete or inaccurate data provided by me or distortions of diagnostic images or specimens that may result from electronic transmissions. I understand that the  practice of medicine is not an Chief Strategy Officer and that Practitioner makes no warranties or guarantees regarding treatment outcomes. I acknowledge that I will receive a copy of this consent concurrently upon execution via email to the email address I last provided but may also request a printed copy by calling the office of [facility].    I understand that my insurance will be billed for this visit.   I have read or had this consent read to me. . I understand the contents of this consent, which adequately explains the benefits and risks of the Services being provided via telemedicine.  . I have been provided ample opportunity to ask questions regarding this consent and the Services and have had my questions answered to  my satisfaction. . I give my informed consent for the services to be provided through the use of telemedicine in my medical care  By participating in this telemedicine visit I agree to the above.

## 2019-02-20 NOTE — Telephone Encounter (Signed)

## 2019-02-21 NOTE — Telephone Encounter (Signed)
Spoke with Receptionist at Dr Nash Dimmer office and informed her that patient completed lab work sometime this year and he wanted a copy of the lab work sent to our office before huis upcoming appointment on Tuesday 02/27/2019. She voiced understanding and stated she would send the results over.

## 2019-02-21 NOTE — Telephone Encounter (Signed)
Contacted patient and informed him that I spoke with Dr Nash Dimmer office and requested his most recent lab work. He voiced understanding and stated he called this morning as well and was informed that someone from our office called. He wanted to know about his billing questions. Informed patient that I sent a message to the billing dept and someone would get back to him. Patient stated that he was going to go by [facility] and have his blood pressure checked before his appointment. I told patient that is fine just write down the reading and give it to the person that calls him to start his visit.

## 2019-02-26 ENCOUNTER — Telehealth: Payer: Self-pay | Admitting: Cardiovascular Disease

## 2019-02-26 NOTE — Telephone Encounter (Signed)
Iphone/prefers phone visit/ my chart/ consent/ pre reg completed

## 2019-02-27 ENCOUNTER — Telehealth (INDEPENDENT_AMBULATORY_CARE_PROVIDER_SITE_OTHER): Payer: PPO | Admitting: Cardiovascular Disease

## 2019-02-27 ENCOUNTER — Encounter

## 2019-02-27 VITALS — BP 116/68 | HR 57 | Ht 68.5 in | Wt 173.0 lb

## 2019-02-27 DIAGNOSIS — I2510 Atherosclerotic heart disease of native coronary artery without angina pectoris: Secondary | ICD-10-CM | POA: Diagnosis not present

## 2019-02-27 DIAGNOSIS — E7800 Pure hypercholesterolemia, unspecified: Secondary | ICD-10-CM | POA: Diagnosis not present

## 2019-02-27 NOTE — Patient Instructions (Signed)
Medication Instructions:  Continue same medications If you need a refill on your cardiac medications before your next appointment, please call your pharmacy.   Lab work: None ordered   Testing/Procedures: None ordered  Follow-Up: At [facility], you and your health needs are our priority.  As part of our continuing mission to provide you with exceptional heart care, we have created designated Provider Care Teams.  These Care Teams include your primary Cardiologist (physician) and Advanced Practice Providers (APPs -  Physician Assistants and Nurse Practitioners) who all work together to provide you with the care you need, when you need it. . Follow Up with Dr.Croitoru in February   Call 3 months before to schedule appointment

## 2019-02-27 NOTE — Progress Notes (Signed)
Virtual Visit via Video Note   This visit type was conducted due to national recommendations for restrictions regarding the COVID-19 Pandemic (e.g. social distancing) in an effort to limit this patient's exposure and mitigate transmission in our community.  Due to his co-morbid illnesses, this patient is at least at moderate risk for complications without adequate follow up.  This format is felt to be most appropriate for this patient at this time.  All issues noted in this document were discussed and addressed.  A limited physical exam was performed with this format.  Please refer to the patient's chart for his consent to telehealth for [facility].   Evaluation Performed:  Follow-up visit  Date:  02/27/2019   ID:  Stephen Morales, DOB 01/11/1946, MRN [id number]  Patient Location: Home Provider Location: Home  PCP:  Ileana LaddWong, Francis P, MD  Cardiologist:  Thurmon FairMihai Katana Berthold, MD  Electrophysiologist:  None   Chief Complaint: Coronary artery disease follow-up  History of Present Illness:    Stephen Morales is a 73 y.o. male, retired Optician, dispensingMinister, with a history of coronary artery disease now roughly 13 years following multivessel bypass surgery.  He has treated hypercholesterolemia.  He remains very physically active.  He exercises 7 days a week.  Even though he can go to the gym anymore he typically walks more than 5 miles, over 10,000 steps every day as well as performing some push-ups and lifting some dumbbells.  He does not have exertional angina or dyspnea and denies palpitations, dizziness, syncope, claudication, lower extremity edema or other cardiovascular complaints.  Excellent lipid profile performed in January with his primary care physician.  His most recent formal ischemic evaluation was a treadmill stress test in 2010 when he exercised for 12 minutes.  His pre-bypass angina pectoris with a sensation of "electrical current" across his anterior chest with numbness in his left forearm.  This has  not recurred since surgery.  The patient does not have symptoms concerning for COVID-19 infection (fever, chills, cough, or new shortness of breath).    Past Medical History:  Diagnosis Date  . Bradycardia   . CAD (coronary artery disease)   . Creatinine elevation    09/19/12 1.4  . Dyslipidemia   . Hypotension, unspecified    borderline  . S/P CABG x 6 01/24/06   Past Surgical History:  Procedure Laterality Date  . CARDIAC CATHETERIZATION  01/20/06   severe 3 vessel disease  . CORONARY ARTERY BYPASS GRAFT  01/24/06   LIMA graft to LAD,seq SVG to the first and second obtuse marg. branches of the left CX, seq SVG to the post descending, first posterolateral and second posterolateral branches of the RCA  . TESTICLE SURGERY       Current Meds  Medication Sig  . aspirin 81 MG tablet Take 81 mg by mouth daily.  [name]. atorvastatin (LIPITOR) 20 MG tablet TAKE 1 TABLET BY MOUTH ONCE DAILY  . B Complex-C (SUPER B COMPLEX PO) Take 1 capsule by mouth daily.  . cetirizine (ZYRTEC) 10 MG tablet Take 10 mg by mouth daily.  . Coenzyme Q10 (COQ10) 200 MG CAPS Take 1 capsule by mouth daily.  . cyanocobalamin 500 MCG tablet Take 500 mcg by mouth daily.  . fluticasone (FLONASE) 50 MCG/ACT nasal spray Place 2 sprays into the nose daily.  [name]. ibuprofen (ADVIL,MOTRIN) 200 MG tablet Take 200 mg by mouth every 6 (six) hours as needed for moderate pain.  . Omega-3 Fatty Acids (FISH OIL) 1200 MG CAPS Take  2 capsules by mouth daily.     Allergies:   Morphine and related; Oxycontin [oxycodone hcl]; and Zocor [simvastatin]   Social History   Tobacco Use  . Smoking status: Never Smoker  . Smokeless tobacco: Never Used  Substance Use Topics  . Alcohol use: No  . Drug use: No     Family Hx: The patient's family history includes Hypertension in his mother.  ROS:   Please see the history of present illness.     All other systems reviewed and are negative.   Prior CV studies:   The following studies  were reviewed today:  Labs/Other Tests and Data Reviewed:    EKG:  An ECG dated 01/24/2018 was personally reviewed today and demonstrated:  Mild sinus bradycardia, questionable left atrial abnormality, no ischemic changes  Recent Labs: Creatinine 1.3, normal LFTs, normal CBC on labs November 14, 2018  Recent Lipid Panel Same date total cholesterol 135, LDL 55, HDL 54, triglycerides 79  Wt Readings from Last 3 Encounters:  02/27/19 173 lb (78.5 kg)  01/23/18 176 lb (79.8 kg)  02/10/17 181 lb (82.1 kg)     Objective:    Vital Signs:  BP 116/68   Pulse (!) 57   Ht 5' 8.5" (1.74 m)   Wt 173 lb (78.5 kg)   BMI 25.92 kg/m    VITAL SIGNS:  reviewed GEN:  no acute distress EYES:  sclerae anicteric, EOMI - Extraocular Movements Intact RESPIRATORY:  normal respiratory effort, symmetric expansion CARDIOVASCULAR:  no peripheral edema SKIN:  no rash, lesions or ulcers. MUSCULOSKELETAL:  no obvious deformities. NEURO:  alert and oriented x 3, no obvious focal deficit PSYCH:  normal affect  ASSESSMENT & PLAN:    1. CAD:  very active lifestyle without any exertional complaints.  No symptoms of ischemic heart disease. 2. HLP: Excellent lipid profile with LDL less than 70 and very good HDL cholesterol, testament to healthy lifestyle.  COVID-19 Education: The signs and symptoms of COVID-19 were discussed with the patient and how to seek care for testing (follow up with PCP or arrange E-visit).  The importance of social distancing was discussed today.  Time:   Today, I have spent 12 minutes with the patient with telehealth technology discussing the above problems.     Medication Adjustments/Labs and Tests Ordered: Current medicines are reviewed at length with the patient today.  Concerns regarding medicines are outlined above.   Tests Ordered: No orders of the defined types were placed in this encounter.   Medication Changes: No orders of the defined types were placed in this  encounter.   Disposition:  Follow up February 2021  SignedThurmon Fair, MD  02/27/2019 8:35 AM    [facility]

## 2019-03-09 ENCOUNTER — Other Ambulatory Visit: Payer: Self-pay | Admitting: Cardiovascular Disease

## 2019-03-09 NOTE — Telephone Encounter (Signed)
Atorvastatin refilled.  

## 2019-05-18 DIAGNOSIS — Z86010 Personal history of colonic polyps: Secondary | ICD-10-CM | POA: Diagnosis not present

## 2019-05-18 DIAGNOSIS — E7800 Pure hypercholesterolemia, unspecified: Secondary | ICD-10-CM | POA: Diagnosis not present

## 2019-05-18 DIAGNOSIS — I25810 Atherosclerosis of coronary artery bypass graft(s) without angina pectoris: Secondary | ICD-10-CM | POA: Diagnosis not present

## 2019-08-23 DIAGNOSIS — H349 Unspecified retinal vascular occlusion: Secondary | ICD-10-CM | POA: Diagnosis not present

## 2019-08-27 DIAGNOSIS — H43813 Vitreous degeneration, bilateral: Secondary | ICD-10-CM | POA: Diagnosis not present

## 2019-08-27 DIAGNOSIS — H34812 Central retinal vein occlusion, left eye, with macular edema: Secondary | ICD-10-CM | POA: Diagnosis not present

## 2019-08-27 DIAGNOSIS — H33103 Unspecified retinoschisis, bilateral: Secondary | ICD-10-CM | POA: Diagnosis not present

## 2019-08-27 DIAGNOSIS — H35033 Hypertensive retinopathy, bilateral: Secondary | ICD-10-CM | POA: Diagnosis not present

## 2019-09-24 DIAGNOSIS — H34812 Central retinal vein occlusion, left eye, with macular edema: Secondary | ICD-10-CM | POA: Diagnosis not present

## 2019-10-05 DIAGNOSIS — B029 Zoster without complications: Secondary | ICD-10-CM | POA: Diagnosis not present

## 2019-10-05 DIAGNOSIS — G4700 Insomnia, unspecified: Secondary | ICD-10-CM | POA: Diagnosis not present

## 2019-10-29 DIAGNOSIS — H34812 Central retinal vein occlusion, left eye, with macular edema: Secondary | ICD-10-CM | POA: Diagnosis not present

## 2019-12-03 DIAGNOSIS — H34812 Central retinal vein occlusion, left eye, with macular edema: Secondary | ICD-10-CM | POA: Diagnosis not present

## 2019-12-03 DIAGNOSIS — H35033 Hypertensive retinopathy, bilateral: Secondary | ICD-10-CM | POA: Diagnosis not present

## 2019-12-03 DIAGNOSIS — H43813 Vitreous degeneration, bilateral: Secondary | ICD-10-CM | POA: Diagnosis not present

## 2019-12-03 DIAGNOSIS — H33103 Unspecified retinoschisis, bilateral: Secondary | ICD-10-CM | POA: Diagnosis not present

## 2019-12-28 ENCOUNTER — Ambulatory Visit: Payer: PPO | Admitting: Cardiovascular Disease

## 2020-01-24 ENCOUNTER — Ambulatory Visit: Payer: PPO | Admitting: Cardiovascular Disease

## 2020-01-28 DIAGNOSIS — H33103 Unspecified retinoschisis, bilateral: Secondary | ICD-10-CM | POA: Diagnosis not present

## 2020-01-28 DIAGNOSIS — H35033 Hypertensive retinopathy, bilateral: Secondary | ICD-10-CM | POA: Diagnosis not present

## 2020-01-28 DIAGNOSIS — H34812 Central retinal vein occlusion, left eye, with macular edema: Secondary | ICD-10-CM | POA: Diagnosis not present

## 2020-01-28 DIAGNOSIS — H43813 Vitreous degeneration, bilateral: Secondary | ICD-10-CM | POA: Diagnosis not present

## 2020-01-30 ENCOUNTER — Encounter: Payer: Self-pay | Admitting: Sports Medicine

## 2020-01-30 NOTE — Telephone Encounter (Signed)
This encounter was created in error - please disregard.

## 2020-02-04 DIAGNOSIS — H34812 Central retinal vein occlusion, left eye, with macular edema: Secondary | ICD-10-CM | POA: Diagnosis not present

## 2020-02-08 DIAGNOSIS — Z0000 Encounter for general adult medical examination without abnormal findings: Secondary | ICD-10-CM | POA: Diagnosis not present

## 2020-02-08 DIAGNOSIS — Z86010 Personal history of colonic polyps: Secondary | ICD-10-CM | POA: Diagnosis not present

## 2020-02-08 DIAGNOSIS — I25810 Atherosclerosis of coronary artery bypass graft(s) without angina pectoris: Secondary | ICD-10-CM | POA: Diagnosis not present

## 2020-02-08 DIAGNOSIS — Z125 Encounter for screening for malignant neoplasm of prostate: Secondary | ICD-10-CM | POA: Diagnosis not present

## 2020-02-08 DIAGNOSIS — Z1211 Encounter for screening for malignant neoplasm of colon: Secondary | ICD-10-CM | POA: Diagnosis not present

## 2020-02-08 DIAGNOSIS — E7800 Pure hypercholesterolemia, unspecified: Secondary | ICD-10-CM | POA: Diagnosis not present

## 2020-02-08 DIAGNOSIS — H3581 Retinal edema: Secondary | ICD-10-CM | POA: Diagnosis not present

## 2020-02-19 ENCOUNTER — Ambulatory Visit: Payer: PPO | Admitting: Cardiovascular Disease

## 2020-02-19 ENCOUNTER — Other Ambulatory Visit: Payer: Self-pay

## 2020-02-19 ENCOUNTER — Encounter: Payer: Self-pay | Admitting: Cardiovascular Disease

## 2020-02-19 VITALS — BP 114/72 | HR 55 | Temp 97.3°F | Resp 14 | Ht 68.5 in | Wt 168.0 lb

## 2020-02-19 DIAGNOSIS — E7800 Pure hypercholesterolemia, unspecified: Secondary | ICD-10-CM | POA: Diagnosis not present

## 2020-02-19 DIAGNOSIS — I2510 Atherosclerotic heart disease of native coronary artery without angina pectoris: Secondary | ICD-10-CM

## 2020-02-19 NOTE — Patient Instructions (Signed)

## 2020-02-19 NOTE — Progress Notes (Signed)
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  He has treated hypercholesterolemia.    He remains very active.  Despite being unable to go to the gym he does push-ups and sit ups at home and walks 2.5-4 miles a day every day of the week.    He had problems with transient macular edema of the left eye due to central retinal vein occlusion, requiring intraocular injections.  Vision has returned to normal.  He had an episode of shingles involving his right anterior chest and the right scapular area.  This is improving.  He plans to get the Shingrix vaccine after 3 months.  The patient specifically denies any chest pain at rest exertion, dyspnea at rest or with exertion, orthopnea, paroxysmal nocturnal dyspnea, syncope, palpitations, focal neurological deficits, intermittent claudication, lower extremity edema, unexplained weight gain, cough, hemoptysis or wheezing.  His most recent formal ischemic evaluation was a treadmill stress test in 2010 when he exercised for 12 minutes.  His pre-bypass angina pectoris with a sensation of "electrical current" across his anterior chest with numbness in his left forearm.  This has not recurred since surgery.  The patient does not have symptoms concerning for COVID-19 infection (fever, chills, cough, or new shortness of breath).    Past Medical History:  Diagnosis Date  . Bradycardia   . CAD (coronary artery disease)   . Creatinine elevation     09/19/12 1.4  . Dyslipidemia   . Hypotension, unspecified    borderline  . S/P CABG x 6 01/24/06   Past Surgical History:  Procedure Laterality Date  . CARDIAC CATHETERIZATION  01/20/06   severe 3 vessel disease  . CORONARY ARTERY BYPASS GRAFT  01/24/06   LIMA graft to LAD,seq SVG to the first and second obtuse marg. branches of the left CX, seq SVG to the post descending, first posterolateral and second posterolateral branches of the RCA  . TESTICLE SURGERY       Current Meds  Medication Sig  . aspirin 81 MG tablet Take 81 mg by mouth daily.  [name] atorvastatin (LIPITOR) 20 MG tablet TAKE 1 TABLET BY MOUTH ONCE DAILY  . B Complex-C (SUPER B COMPLEX PO) Take 1 capsule by mouth daily.  . cetirizine (ZYRTEC) 10 MG tablet Take 10 mg by mouth daily.  . Coenzyme Q10 (COQ10) 200 MG CAPS Take 1 capsule by mouth daily.  . cyanocobalamin 500 MCG tablet Take 500 mcg by mouth daily.  . fluticasone (FLONASE) 50 MCG/ACT nasal spray Place 2 sprays into the nose daily.  [name] ibuprofen (ADVIL,MOTRIN) 200 MG tablet Take 200 mg by mouth every 6 (six) hours as needed for moderate pain.  . Omega-3 Fatty Acids (FISH OIL) 1200 MG CAPS Take 2 capsules by mouth daily.  . traZODone (DESYREL) 50 MG tablet Take 25-50 mg by mouth at bedtime as needed.  . valACYclovir (VALTREX) 1000 MG tablet Take 1,000 mg by mouth 3 (three) times daily.  Allergies:   Morphine and related, Oxycontin [oxycodone hcl], and Zocor [simvastatin]   Social History   Tobacco Use  . Smoking status: Never Smoker  . Smokeless tobacco: Never Used  Substance Use Topics  . Alcohol use: No  . Drug use: No     Family Hx: The patient's family history includes Hypertension in his mother.  ROS:   Please see the history of present illness.    All other systems are reviewed and are negative.  Prior CV studies:   The following studies were reviewed today:  Labs/Other Tests and Data Reviewed:    EKG:  An ECG was ordered today and shows  sinus bradycardia 56 bpm, left atrial abnormality, otherwise normal tracing.  QTc 403 ms  Recent Labs: Creatinine 1.36, potassium 5.4  Recent Lipid Panel Total cholesterol 134, HDL 59, LDL 62, triglycerides 66  Wt Readings from Last 3 Encounters:  02/19/20 168 lb (76.2 kg)  02/27/19 173 lb (78.5 kg)  01/23/18 176 lb (79.8 kg)     Objective:    Vital Signs:  BP 114/72   Pulse (!) 55   Temp (!) 97.3 F (36.3 C)   Resp 14   Ht 5' 8.5" (1.74 m)   Wt 168 lb (76.2 kg)   SpO2 95%   BMI 25.17 kg/m     General: Alert, oriented x3, no distress, appears healthy and fit, younger than stated age Head: no evidence of trauma, PERRL, EOMI, no exophtalmos or lid lag, no myxedema, no xanthelasma; normal ears, nose and oropharynx Neck: normal jugular venous pulsations and no hepatojugular reflux; brisk carotid pulses without delay and no carotid bruits Chest: clear to auscultation, no signs of consolidation by percussion or palpation, normal fremitus, symmetrical and full respiratory excursions Cardiovascular: normal position and quality of the apical impulse, regular rhythm, normal first and second heart sounds, no murmurs, rubs or gallops Abdomen: no tenderness or distention, no masses by palpation, no abnormal pulsatility or arterial bruits, normal bowel sounds, no hepatosplenomegaly Extremities: no clubbing, cyanosis or edema; 2+ radial, ulnar and brachial pulses bilaterally; 2+ right femoral, posterior tibial and dorsalis pedis pulses; 2+ left femoral, posterior tibial and dorsalis pedis pulses; no subclavian or femoral bruits Neurological: grossly nonfocal Psych: Normal mood and affect '  ASSESSMENT & PLAN:    1. Coronary artery disease involving native coronary artery of native heart without angina pectoris   2. Pure hypercholesterolemia       1. CAD: Asymptomatic almost 14 years since bypass surgery, with a very active lifestyle.  The focus is on risk factor modification, all of  which are well addressed. 2. HLP: Excellent lipid profile.  LDL at target less than 60 and excellent HDL cholesterol due to his very healthy and active lifestyle.  COVID-19 Education: The signs and symptoms of COVID-19 were discussed with the patient and how to seek care for testing (follow up with PCP or arrange E-visit).  The importance of social distancing was discussed today.  Time:   Today, I have spent 20 minutes with the patient.    Medication Adjustments/Labs and Tests Ordered: Current medicines are reviewed at length with the patient today.  Concerns regarding medicines are outlined above.   Tests Ordered: Orders Placed This Encounter  Procedures  . EKG 12-Lead    Medication Changes: No orders of the defined types were placed in this encounter.   Disposition:  Follow up February 2021  SignedSanda Klein, MD  02/22/2020 1:33 PM    [facility]

## 2020-03-10 DIAGNOSIS — H34812 Central retinal vein occlusion, left eye, with macular edema: Secondary | ICD-10-CM | POA: Diagnosis not present

## 2020-03-10 DIAGNOSIS — H43813 Vitreous degeneration, bilateral: Secondary | ICD-10-CM | POA: Diagnosis not present

## 2020-03-10 DIAGNOSIS — H43821 Vitreomacular adhesion, right eye: Secondary | ICD-10-CM | POA: Diagnosis not present

## 2020-04-21 DIAGNOSIS — H43813 Vitreous degeneration, bilateral: Secondary | ICD-10-CM | POA: Diagnosis not present

## 2020-04-21 DIAGNOSIS — H34812 Central retinal vein occlusion, left eye, with macular edema: Secondary | ICD-10-CM | POA: Diagnosis not present

## 2020-06-20 DIAGNOSIS — H35033 Hypertensive retinopathy, bilateral: Secondary | ICD-10-CM | POA: Diagnosis not present

## 2020-06-20 DIAGNOSIS — H34812 Central retinal vein occlusion, left eye, with macular edema: Secondary | ICD-10-CM | POA: Diagnosis not present

## 2020-06-20 DIAGNOSIS — H43813 Vitreous degeneration, bilateral: Secondary | ICD-10-CM | POA: Diagnosis not present

## 2020-06-20 DIAGNOSIS — H33103 Unspecified retinoschisis, bilateral: Secondary | ICD-10-CM | POA: Diagnosis not present

## 2020-08-05 DIAGNOSIS — H34812 Central retinal vein occlusion, left eye, with macular edema: Secondary | ICD-10-CM | POA: Diagnosis not present

## 2020-09-15 DIAGNOSIS — H34812 Central retinal vein occlusion, left eye, with macular edema: Secondary | ICD-10-CM | POA: Diagnosis not present

## 2020-11-10 DIAGNOSIS — H34812 Central retinal vein occlusion, left eye, with macular edema: Secondary | ICD-10-CM | POA: Diagnosis not present

## 2020-11-10 DIAGNOSIS — H35033 Hypertensive retinopathy, bilateral: Secondary | ICD-10-CM | POA: Diagnosis not present

## 2020-11-10 DIAGNOSIS — H43813 Vitreous degeneration, bilateral: Secondary | ICD-10-CM | POA: Diagnosis not present

## 2020-12-02 DIAGNOSIS — H3500 Unspecified background retinopathy: Secondary | ICD-10-CM | POA: Diagnosis not present

## 2020-12-02 DIAGNOSIS — H349 Unspecified retinal vascular occlusion: Secondary | ICD-10-CM | POA: Diagnosis not present

## 2020-12-02 DIAGNOSIS — H52203 Unspecified astigmatism, bilateral: Secondary | ICD-10-CM | POA: Diagnosis not present

## 2020-12-15 DIAGNOSIS — E7800 Pure hypercholesterolemia, unspecified: Secondary | ICD-10-CM | POA: Diagnosis not present

## 2020-12-15 DIAGNOSIS — Z125 Encounter for screening for malignant neoplasm of prostate: Secondary | ICD-10-CM | POA: Diagnosis not present

## 2020-12-15 DIAGNOSIS — I25810 Atherosclerosis of coronary artery bypass graft(s) without angina pectoris: Secondary | ICD-10-CM | POA: Diagnosis not present

## 2020-12-22 DIAGNOSIS — H34812 Central retinal vein occlusion, left eye, with macular edema: Secondary | ICD-10-CM | POA: Diagnosis not present

## 2020-12-22 DIAGNOSIS — H43813 Vitreous degeneration, bilateral: Secondary | ICD-10-CM | POA: Diagnosis not present

## 2020-12-22 DIAGNOSIS — H35361 Drusen (degenerative) of macula, right eye: Secondary | ICD-10-CM | POA: Diagnosis not present

## 2020-12-22 DIAGNOSIS — H35033 Hypertensive retinopathy, bilateral: Secondary | ICD-10-CM | POA: Diagnosis not present

## 2021-01-26 DIAGNOSIS — H43813 Vitreous degeneration, bilateral: Secondary | ICD-10-CM | POA: Diagnosis not present

## 2021-01-26 DIAGNOSIS — H34812 Central retinal vein occlusion, left eye, with macular edema: Secondary | ICD-10-CM | POA: Diagnosis not present

## 2021-01-29 ENCOUNTER — Telehealth: Payer: Self-pay | Admitting: Cardiovascular Disease

## 2021-01-29 NOTE — Telephone Encounter (Signed)
Spoke with pt and made him aware that we did receive his labs.  Advised no issues noted.  Pt appreciative for call.

## 2021-01-29 NOTE — Telephone Encounter (Signed)
Patient states around the end of February he saw Dr. Modesto Charon and he had lab work with their office. He states Dr. Modesto Charon informed him that he would have the results sent to our office. He would like to ensure that the results have been reviewed.

## 2021-02-20 DIAGNOSIS — H3581 Retinal edema: Secondary | ICD-10-CM | POA: Diagnosis not present

## 2021-02-20 DIAGNOSIS — Z1211 Encounter for screening for malignant neoplasm of colon: Secondary | ICD-10-CM | POA: Diagnosis not present

## 2021-02-20 DIAGNOSIS — I25810 Atherosclerosis of coronary artery bypass graft(s) without angina pectoris: Secondary | ICD-10-CM | POA: Diagnosis not present

## 2021-02-20 DIAGNOSIS — Z0000 Encounter for general adult medical examination without abnormal findings: Secondary | ICD-10-CM | POA: Diagnosis not present

## 2021-02-20 DIAGNOSIS — Z86010 Personal history of colonic polyps: Secondary | ICD-10-CM | POA: Diagnosis not present

## 2021-02-20 DIAGNOSIS — E7800 Pure hypercholesterolemia, unspecified: Secondary | ICD-10-CM | POA: Diagnosis not present

## 2021-02-20 DIAGNOSIS — N183 Chronic kidney disease, stage 3 unspecified: Secondary | ICD-10-CM | POA: Diagnosis not present

## 2021-02-20 DIAGNOSIS — Z125 Encounter for screening for malignant neoplasm of prostate: Secondary | ICD-10-CM | POA: Diagnosis not present

## 2021-02-23 DIAGNOSIS — Z0000 Encounter for general adult medical examination without abnormal findings: Secondary | ICD-10-CM | POA: Diagnosis not present

## 2021-03-06 ENCOUNTER — Encounter: Payer: Self-pay | Admitting: Cardiovascular Disease

## 2021-03-06 ENCOUNTER — Other Ambulatory Visit: Payer: Self-pay

## 2021-03-06 ENCOUNTER — Ambulatory Visit: Payer: PPO | Admitting: Cardiovascular Disease

## 2021-03-06 VITALS — BP 110/70 | HR 57 | Ht 68.5 in | Wt 168.2 lb

## 2021-03-06 DIAGNOSIS — E7800 Pure hypercholesterolemia, unspecified: Secondary | ICD-10-CM | POA: Diagnosis not present

## 2021-03-06 DIAGNOSIS — I2510 Atherosclerotic heart disease of native coronary artery without angina pectoris: Secondary | ICD-10-CM

## 2021-03-06 NOTE — Patient Instructions (Signed)
Medication Instructions:  No changes *If you need a refill on your cardiac medications before your next appointment, please call your pharmacy*   Lab Work: None ordered If you have labs (blood work) drawn today and your tests are completely normal, you will receive your results only by: . MyChart Message (if you have MyChart) OR . A paper copy in the mail If you have any lab test that is abnormal or we need to change your treatment, we will call you to review the results.   Testing/Procedures: None ordered   Follow-Up: At [facility], you and your health needs are our priority.  As part of our continuing mission to provide you with exceptional heart care, we have created designated Provider Care Teams.  These Care Teams include your primary Cardiologist (physician) and Advanced Practice Providers (APPs -  Physician Assistants and Nurse Practitioners) who all work together to provide you with the care you need, when you need it.  We recommend signing up for the patient portal called "MyChart".  Sign up information is provided on this After Visit Summary.  MyChart is used to connect with patients for Virtual Visits (Telemedicine).  Patients are able to view lab/test results, encounter notes, upcoming appointments, etc.  Non-urgent messages can be sent to your provider as well.   To learn more about what you can do with MyChart, go to [email or web address].    Your next appointment:   10 month(s)  The format for your next appointment:   In Person  Provider:   You may see Mihai Croitoru, MD or one of the following Advanced Practice Providers on your designated Care Team:    Hao Meng, PA-C  Angela Duke, PA-C or   Krista Kroeger, PA-C    

## 2021-03-06 NOTE — Progress Notes (Signed)
Cardiology office note    Evaluation Performed:  Follow-up visit  Date:  03/12/2021   ID:  Stephen Morales, DOB 1946-05-03, MRN [id number]  PCP:  Ileana Ladd, MD  Cardiologist:  Thurmon Fair, MD  Electrophysiologist:  None   Chief Complaint: Coronary artery disease follow-up  History of Present Illness:    Stephen Morales is a 75 y.o. male, retired Optician, dispensing, with a history of coronary artery disease s/p multivessel bypass surgery in 2007 (LIMA to LAD, sequential SVG to OM1-OM 2, sequential SVG to PDA, PL 1, PL 2), without subsequent coronary events.  He has treated hypercholesterolemia.    He continues to exercise regularly.  He has a home gym and does sit ups and push-ups and on most days of the week he will walk between 2 and 4 miles.  He occasionally gets foot cramps.  He remains very active.  Despite being unable to go to the gym he does push-ups and sit ups at home and walks 2.5-4 miles a day every day of the week.    The patient specifically denies any chest pain at rest exertion, dyspnea at rest or with exertion, orthopnea, paroxysmal nocturnal dyspnea, syncope, palpitations, focal neurological deficits, intermittent claudication, lower extremity edema, unexplained weight gain, cough, hemoptysis or wheezing.  Has a history of central retinal vein occlusion, but this has resolved and his vision is normal.  He had shingles last year (right chest).   His pre-bypass angina pectoris with a sensation of "electrical current" across his anterior chest with numbness in his left forearm.  This has not recurred since surgery.    Past Medical History:  Diagnosis Date  . Bradycardia   . CAD (coronary artery disease)   . Creatinine elevation    09/19/12 1.4  . Dyslipidemia   . Hypotension, unspecified    borderline  . S/P CABG x 6 01/24/06   Past Surgical History:  Procedure Laterality Date  . CARDIAC CATHETERIZATION  01/20/06   severe 3 vessel disease  . CORONARY ARTERY BYPASS  GRAFT  01/24/06   LIMA graft to LAD,seq SVG to the first and second obtuse marg. branches of the left CX, seq SVG to the post descending, first posterolateral and second posterolateral branches of the RCA  . TESTICLE SURGERY       Current Meds  Medication Sig  . acetaminophen (TYLENOL) 325 MG tablet Take 650 mg by mouth every 6 (six) hours as needed.  [name] aspirin 81 MG tablet Take 81 mg by mouth daily.  [name] atorvastatin (LIPITOR) 20 MG tablet TAKE 1 TABLET BY MOUTH ONCE DAILY  . B Complex-C (SUPER B COMPLEX PO) Take 1 capsule by mouth daily.  . cetirizine (ZYRTEC) 10 MG tablet Take 10 mg by mouth daily.  . Coenzyme Q10 (COQ10) 200 MG CAPS Take 1 capsule by mouth daily.  . cyanocobalamin 500 MCG tablet Take 500 mcg by mouth daily.  . fluticasone (FLONASE) 50 MCG/ACT nasal spray Place 2 sprays into the nose daily.  . Magnesium 200 MG TABS 1 capsule with a meal  . Omega-3 Fatty Acids (FISH OIL) 1200 MG CAPS Take 2 capsules by mouth daily.     Allergies:   Morphine and related, Oxycontin [oxycodone hcl], and Zocor [simvastatin]   Social History   Tobacco Use  . Smoking status: Never Smoker  . Smokeless tobacco: Never Used  Substance Use Topics  . Alcohol use: No  . Drug use: No     Family Hx: The patient's family  history includes Hypertension in his mother.  ROS:   Please see the history of present illness.    All other systems are reviewed and are negative.  Prior CV studies:   The following studies were reviewed today:  Labs/Other Tests and Data Reviewed:    EKG:  An ECG was ordered today and shows sinus bradycardia with a single PAC, otherwise normal.  No repolarization changes, QTC 389 ms  Recent Labs: 12/15/2020 Creatinine 1.32, potassium 5.0, normal liver function tests  Recent Lipid Panel 12/15/2020 Cholesterol 135, HDL 61, LDL 60, triglycerides 68  Wt Readings from Last 3 Encounters:  03/06/21 168 lb 3.2 oz (76.3 kg)  02/19/20 168 lb (76.2 kg)  02/27/19 173 lb  (78.5 kg)     Objective:    Vital Signs:  BP 110/70 (BP Location: Left Arm, Patient Position: Sitting, Cuff Size: Normal)   Pulse (!) 57   Ht 5' 8.5" (1.74 m)   Wt 168 lb 3.2 oz (76.3 kg)   BMI 25.20 kg/m     General: Alert, oriented x3, no distress, appears healthy and fit, younger than stated age Head: no evidence of trauma, PERRL, EOMI, no exophtalmos or lid lag, no myxedema, no xanthelasma; normal ears, nose and oropharynx Neck: normal jugular venous pulsations and no hepatojugular reflux; brisk carotid pulses without delay and no carotid bruits Chest: clear to auscultation, no signs of consolidation by percussion or palpation, normal fremitus, symmetrical and full respiratory excursions Cardiovascular: normal position and quality of the apical impulse, regular rhythm, normal first and second heart sounds, no murmurs, rubs or gallops Abdomen: no tenderness or distention, no masses by palpation, no abnormal pulsatility or arterial bruits, normal bowel sounds, no hepatosplenomegaly Extremities: no clubbing, cyanosis or edema; 2+ radial, ulnar and brachial pulses bilaterally; 2+ right femoral, posterior tibial and dorsalis pedis pulses; 2+ left femoral, posterior tibial and dorsalis pedis pulses; no subclavian or femoral bruits Neurological: grossly nonfocal Psych: Normal mood and affect '  ASSESSMENT & PLAN:    1. Coronary artery disease involving native coronary artery of native heart without angina pectoris   2. Pure hypercholesterolemia       1. CAD: 15 years postop he remains asymptomatic and is very physically active.  To be congratulated on his commitment to healthy lifestyle. 2. HLP: All lipid parameters in normal range.  Continue same medications.   COVID-19 Education: The signs and symptoms of COVID-19 were discussed with the patient and how to seek care for testing (follow up with PCP or arrange E-visit).  The importance of social distancing was discussed  today.  Time:   Today, I have spent 20 minutes with the patient.    Medication Adjustments/Labs and Tests Ordered: Current medicines are reviewed at length with the patient today.  Concerns regarding medicines are outlined above.   Tests Ordered: Orders Placed This Encounter  Procedures  . EKG 12-Lead   Patient Instructions  Medication Instructions:  No changes *If you need a refill on your cardiac medications before your next appointment, please call your pharmacy*   Lab Work: None ordered If you have labs (blood work) drawn today and your tests are completely normal, you will receive your results only by: [name] MyChart Message (if you have MyChart) OR . A paper copy in the mail If you have any lab test that is abnormal or we need to change your treatment, we will call you to review the results.   Testing/Procedures: None ordered   Follow-Up: At [facility],  you and your health needs are our priority.  As part of our continuing mission to provide you with exceptional heart care, we have created designated Provider Care Teams.  These Care Teams include your primary Cardiologist (physician) and Advanced Practice Providers (APPs -  Physician Assistants and Nurse Practitioners) who all work together to provide you with the care you need, when you need it.  We recommend signing up for the patient portal called "MyChart".  Sign up information is provided on this After Visit Summary.  MyChart is used to connect with patients for Virtual Visits (Telemedicine).  Patients are able to view lab/test results, encounter notes, upcoming appointments, etc.  Non-urgent messages can be sent to your provider as well.   To learn more about what you can do with MyChart, go to [email or web address].    Your next appointment:   10 month(s)  The format for your next appointment:   In Person  Provider:   You may see Thurmon Fair, MD or one of the following Advanced Practice Providers on your  designated Care Team:    Azalee Course, PA-C  Micah Flesher, [place] or   Judy Pimple, PA-C       Signed, Thurmon Fair, MD  03/12/2021 5:13 PM     [facility]

## 2021-03-09 DIAGNOSIS — H43813 Vitreous degeneration, bilateral: Secondary | ICD-10-CM | POA: Diagnosis not present

## 2021-03-09 DIAGNOSIS — H34812 Central retinal vein occlusion, left eye, with macular edema: Secondary | ICD-10-CM | POA: Diagnosis not present

## 2021-04-21 DIAGNOSIS — H35361 Drusen (degenerative) of macula, right eye: Secondary | ICD-10-CM | POA: Diagnosis not present

## 2021-04-21 DIAGNOSIS — H35033 Hypertensive retinopathy, bilateral: Secondary | ICD-10-CM | POA: Diagnosis not present

## 2021-04-21 DIAGNOSIS — H43813 Vitreous degeneration, bilateral: Secondary | ICD-10-CM | POA: Diagnosis not present

## 2021-04-21 DIAGNOSIS — H34812 Central retinal vein occlusion, left eye, with macular edema: Secondary | ICD-10-CM | POA: Diagnosis not present

## 2021-06-01 DIAGNOSIS — H34812 Central retinal vein occlusion, left eye, with macular edema: Secondary | ICD-10-CM | POA: Diagnosis not present

## 2021-06-01 DIAGNOSIS — H43813 Vitreous degeneration, bilateral: Secondary | ICD-10-CM | POA: Diagnosis not present

## 2021-08-03 DIAGNOSIS — H34812 Central retinal vein occlusion, left eye, with macular edema: Secondary | ICD-10-CM | POA: Diagnosis not present

## 2021-08-03 DIAGNOSIS — H43813 Vitreous degeneration, bilateral: Secondary | ICD-10-CM | POA: Diagnosis not present

## 2021-08-03 DIAGNOSIS — H35363 Drusen (degenerative) of macula, bilateral: Secondary | ICD-10-CM | POA: Diagnosis not present

## 2021-10-09 DIAGNOSIS — H34812 Central retinal vein occlusion, left eye, with macular edema: Secondary | ICD-10-CM | POA: Diagnosis not present

## 2021-10-09 DIAGNOSIS — H43813 Vitreous degeneration, bilateral: Secondary | ICD-10-CM | POA: Diagnosis not present

## 2021-11-10 DIAGNOSIS — E7800 Pure hypercholesterolemia, unspecified: Secondary | ICD-10-CM | POA: Diagnosis not present

## 2021-11-10 DIAGNOSIS — Z86010 Personal history of colonic polyps: Secondary | ICD-10-CM | POA: Diagnosis not present

## 2021-11-10 DIAGNOSIS — I25810 Atherosclerosis of coronary artery bypass graft(s) without angina pectoris: Secondary | ICD-10-CM | POA: Diagnosis not present

## 2021-11-10 DIAGNOSIS — N183 Chronic kidney disease, stage 3 unspecified: Secondary | ICD-10-CM | POA: Diagnosis not present

## 2021-11-10 DIAGNOSIS — H3581 Retinal edema: Secondary | ICD-10-CM | POA: Diagnosis not present

## 2021-12-03 DIAGNOSIS — H25813 Combined forms of age-related cataract, bilateral: Secondary | ICD-10-CM | POA: Diagnosis not present

## 2021-12-03 DIAGNOSIS — H3500 Unspecified background retinopathy: Secondary | ICD-10-CM | POA: Diagnosis not present

## 2021-12-03 DIAGNOSIS — H5203 Hypermetropia, bilateral: Secondary | ICD-10-CM | POA: Diagnosis not present

## 2021-12-03 DIAGNOSIS — H02831 Dermatochalasis of right upper eyelid: Secondary | ICD-10-CM | POA: Diagnosis not present

## 2021-12-29 DIAGNOSIS — H34812 Central retinal vein occlusion, left eye, with macular edema: Secondary | ICD-10-CM | POA: Diagnosis not present

## 2021-12-29 DIAGNOSIS — H35033 Hypertensive retinopathy, bilateral: Secondary | ICD-10-CM | POA: Diagnosis not present

## 2021-12-29 DIAGNOSIS — H43813 Vitreous degeneration, bilateral: Secondary | ICD-10-CM | POA: Diagnosis not present

## 2021-12-29 DIAGNOSIS — H35361 Drusen (degenerative) of macula, right eye: Secondary | ICD-10-CM | POA: Diagnosis not present

## 2022-01-05 ENCOUNTER — Ambulatory Visit (INDEPENDENT_AMBULATORY_CARE_PROVIDER_SITE_OTHER): Payer: PPO | Admitting: Cardiovascular Disease

## 2022-01-05 ENCOUNTER — Encounter: Payer: Self-pay | Admitting: Cardiovascular Disease

## 2022-01-05 ENCOUNTER — Ambulatory Visit (INDEPENDENT_AMBULATORY_CARE_PROVIDER_SITE_OTHER): Payer: PPO

## 2022-01-05 ENCOUNTER — Other Ambulatory Visit: Payer: Self-pay

## 2022-01-05 VITALS — BP 128/82 | HR 55 | Ht 69.0 in | Wt 166.0 lb

## 2022-01-05 DIAGNOSIS — I2510 Atherosclerotic heart disease of native coronary artery without angina pectoris: Secondary | ICD-10-CM | POA: Diagnosis not present

## 2022-01-05 DIAGNOSIS — I491 Atrial premature depolarization: Secondary | ICD-10-CM

## 2022-01-05 DIAGNOSIS — E7800 Pure hypercholesterolemia, unspecified: Secondary | ICD-10-CM

## 2022-01-05 NOTE — Progress Notes (Unsigned)
Enrolled for [employer] to mail a ZIO XT long term holter monitor to the patients address on file.  

## 2022-01-05 NOTE — Patient Instructions (Signed)
Medication Instructions:  ?No changes ?*If you need a refill on your cardiac medications before your next appointment, please call your pharmacy* ? ? ?Lab Work: ?None ordered ?If you have labs (blood work) drawn today and your tests are completely normal, you will receive your results only by: ?MyChart Message (if you have MyChart) OR ?A paper copy in the mail ?If you have any lab test that is abnormal or we need to change your treatment, we will call you to review the results. ? ? ?Testing/Procedures: ?ZIO XT- Long Term Monitor Instructions ? ?Your physician has requested you wear a ZIO patch monitor for 14 days.  ?This is a single patch monitor. [employer] supplies one patch monitor per enrollment. Additional ?stickers are not available. Please do not apply patch if you will be having a Nuclear Stress Test,  ?Echocardiogram, Cardiac CT, MRI, or Chest Xray during the period you would be wearing the  ?monitor. The patch cannot be worn during these tests. You cannot remove and re-apply the  ?ZIO XT patch monitor.  ?Your ZIO patch monitor will be mailed 3 day USPS to your address on file. It may take 3-5 days  ?to receive your monitor after you have been enrolled.  ?Once you have received your monitor, please review the enclosed instructions. Your monitor  ?has already been registered assigning a specific monitor serial # to you. ? ?Billing and Patient Assistance Program Information ? ?We have supplied [employer] with any of your insurance information on file for billing purposes. ?[employer] offers a sliding scale Patient Assistance Program for patients that do not have  ?insurance, or whose insurance does not completely cover the cost of the ZIO monitor.  ?You must apply for the Patient Assistance Program to qualify for this discounted rate.  ?To apply, please call [employer] at [phone], select option 4, select option 2, ask to apply for  ?Patient Assistance Program. Theodore Demark will ask your household income, and how many  people  ?are in your household. They will quote your out-of-pocket cost based on that information.  ?[employer] will also be able to set up a 56-month interest-free payment plan if needed. ? ?Applying the monitor ?  ?Shave hair from upper left chest.  ?Hold abrader disc by orange tab. Rub abrader in 40 strokes over the upper left chest as  ?indicated in your monitor instructions.  ?Clean area with 4 enclosed alcohol pads. Let dry.  ?Apply patch as indicated in monitor instructions. Patch will be placed under collarbone on left  ?side of chest with arrow pointing upward.  ?Rub patch adhesive wings for 2 minutes. Remove white label marked "1". Remove the white  ?label marked "2". Rub patch adhesive wings for 2 additional minutes.  ?While looking in a mirror, press and release button in center of patch. A small green light will  ?flash 3-4 times. This will be your only indicator that the monitor has been turned on.  ?Do not shower for the first 24 hours. You may shower after the first 24 hours.  ?Press the button if you feel a symptom. You will hear a small click. Record Date, Time and  ?Symptom in the Patient Logbook.  ?When you are ready to remove the patch, follow instructions on the last 2 pages of Patient  ?Logbook. Stick patch monitor onto the last page of Patient Logbook.  ?Place Patient Logbook in the blue and white box. Use locking tab on box and tape box closed  ?securely. The blue and white box  has prepaid postage on it. Please place it in the mailbox as  ?soon as possible. Your physician should have your test results approximately 7 days after the  ?monitor has been mailed back to [facility].  ?Call [facility] at [phone] if you have questions regarding  ?your ZIO XT patch monitor. Call them immediately if you see an orange light blinking on your  ?monitor.  ?If your monitor falls off in less than 4 days, contact our Monitor department at [phone].  ?If your monitor becomes  loose or falls off after 4 days call [employer] at [phone] for  ?suggestions on securing your monitor ? ? ? ?Follow-Up: ?At [facility], you and your health needs are our priority.  As part of our continuing mission to provide you with exceptional heart care, we have created designated Provider Care Teams.  These Care Teams include your primary Cardiologist (physician) and Advanced Practice Providers (APPs -  Physician Assistants and Nurse Practitioners) who all work together to provide you with the care you need, when you need it. ? ?We recommend signing up for the patient portal called "MyChart".  Sign up information is provided on this After Visit Summary.  MyChart is used to connect with patients for Virtual Visits (Telemedicine).  Patients are able to view lab/test results, encounter notes, upcoming appointments, etc.  Non-urgent messages can be sent to your provider as well.   ?To learn more about what you can do with MyChart, go to [email or web address].   ? ?Your next appointment:   ?12 month(s) ? ?The format for your next appointment:   ?In Person ? ?Provider:   ?Thurmon Fair, MD { ? ? ?Other Instructions ?Premature Atrial Contractions (PAC) ? ?

## 2022-01-05 NOTE — Progress Notes (Signed)
Cardiology office note    Evaluation Performed:  Follow-up visit  Date:  01/05/2022   ID:  Stephen Morales, DOB Jun 11, 1946, MRN [id number]  PCP:  Ileana Ladd, MD  Cardiologist:  Thurmon Fair, MD  Electrophysiologist:  None   Chief Complaint: Coronary artery disease follow-up  History of Present Illness:    Stephen Morales is a 76 y.o. male, retired Optician, dispensing, with a history of coronary artery disease s/p multivessel bypass surgery in 2007 (LIMA to LAD, sequential SVG to OM1-OM 2, sequential SVG to PDA, PL 1, PL 2), without subsequent coronary events.  He has treated hypercholesterolemia.    He continues to be very physically active.  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  He does push-ups and sit ups almost every day.  His phone shows that he walks 3-4 miles a day.  He has had a slight discomfort in his left pectoralis and axillary area which is decidedly musculoskeletal.  He does not have chest pain or shortness of breath during physical activity or at rest.  He denies edema, palpitations, orthopnea, PND, syncope intermittent claudication or any focal neurological events.  On ECG he had a single PAC but during physical exam multiple ectopic beats are heard in a short period of time.  He was not aware of them as palpitations.  Has a history of central retinal vein occlusion, but this has resolved and his vision is normal.  He had shingles last year (right chest).  His pre-bypass angina pectoris with a sensation of "electrical current" across his anterior chest with numbness in his left forearm.  This has not recurred since surgery.    Past Medical History:  Diagnosis Date   Bradycardia    CAD (coronary artery disease)    Creatinine elevation    09/19/12 1.4   Dyslipidemia    Hypotension, unspecified    borderline   S/P CABG x 6 01/24/06   Past Surgical History:  Procedure Laterality  Date   CARDIAC CATHETERIZATION  01/20/06   severe 3 vessel disease   CORONARY ARTERY BYPASS GRAFT  01/24/06   LIMA graft to LAD,seq SVG to the first and second obtuse marg. branches of the left CX, seq SVG to the post descending, first posterolateral and second posterolateral branches of the RCA   TESTICLE SURGERY       Current Meds  Medication Sig   acetaminophen (TYLENOL) 325 MG tablet Take 650 mg by mouth every 6 (six) hours as needed.   aspirin 81 MG tablet Take 81 mg by mouth daily.   atorvastatin (LIPITOR) 20 MG tablet TAKE 1 TABLET BY MOUTH ONCE DAILY   B Complex-C (SUPER B COMPLEX PO) Take 1 capsule by mouth daily.   cetirizine (ZYRTEC) 10 MG tablet Take 10 mg by mouth daily.   Coenzyme Q10 (COQ10) 200 MG CAPS Take 1 capsule by mouth daily.   cyanocobalamin 500 MCG tablet Take 500 mcg by mouth daily.   fluticasone (FLONASE) 50 MCG/ACT nasal spray Place 2 sprays into the nose daily.   Magnesium 200 MG TABS 1 capsule with a meal   Omega-3 Fatty Acids (FISH OIL) 1200 MG CAPS Take 2 capsules by mouth daily.     Allergies:   Morphine and related, Oxycontin [oxycodone hcl], and Zocor [simvastatin]   Social History   Tobacco Use   Smoking status: Never   Smokeless tobacco:  Never  Substance Use Topics   Alcohol use: No   Drug use: No     Family Hx: The patient's family history includes Hypertension in his mother.  ROS:   Please see the history of present illness.    All other systems are reviewed and are negative.  Prior CV studies:   The following studies were reviewed today:  Labs/Other Tests and Data Reviewed:    EKG: Performed today, shows sinus bradycardia with a single PAC, no repolarization abnormalities.  Questionable left atrial abnormality.  QTc uncorrected 410 ms.  Recent Labs: 12/15/2020 Creatinine 1.32, potassium 5.0, normal liver function tests  Recent Lipid Panel 12/15/2020 Cholesterol 135, HDL 61, LDL 60, triglycerides 68  Wt Readings from Last 3  Encounters:  01/05/22 166 lb (75.3 kg)  03/06/21 168 lb 3.2 oz (76.3 kg)  02/19/20 168 lb (76.2 kg)     Objective:    Vital Signs:  BP 128/82   Pulse (!) 55   Ht 5\' 9"  (1.753 m)   Wt 166 lb (75.3 kg)   BMI 24.51 kg/m     General: Alert, oriented x3, no distress, healthy and fit appearing, younger than stated age appearance. Head: no evidence of trauma, PERRL, EOMI, no exophtalmos or lid lag, no myxedema, no xanthelasma; normal ears, nose and oropharynx Neck: normal jugular venous pulsations and no hepatojugular reflux; brisk carotid pulses without delay and no carotid bruits Chest: clear to auscultation, no signs of consolidation by percussion or palpation, normal fremitus, symmetrical and full respiratory excursions Cardiovascular: normal position and quality of the apical impulse, regular rhythm with frequent but isolated ectopic beats, normal first and second heart sounds, no murmurs, rubs or gallops Abdomen: no tenderness or distention, no masses by palpation, no abnormal pulsatility or arterial bruits, normal bowel sounds, no hepatosplenomegaly Extremities: no clubbing, cyanosis or edema; 2+ radial, ulnar and brachial pulses bilaterally; 2+ right femoral, posterior tibial and dorsalis pedis pulses; 2+ left femoral, posterior tibial and dorsalis pedis pulses; no subclavian or femoral bruits Neurological: grossly nonfocal Psych: Normal mood and affect   ASSESSMENT & PLAN:    1. Coronary artery disease involving native coronary artery of native heart without angina pectoris   2. PAC (premature atrial contraction)   3. Pure hypercholesterolemia        CAD: 16 years have passed since bypass surgery and he has remained completely asymptomatic despite a very active lifestyle.  He is truly committed to exercise and healthy diet.  All risk factors are well addressed. HLP: Excellent lipid profile on current medications. PACs: Surprisingly frequent on physical exam.  We will have him  wear monitor to make sure he is not having episodes of asymptomatic atrial fibrillation.   COVID-19 Education: The signs and symptoms of COVID-19 were discussed with the patient and how to seek care for testing (follow up with PCP or arrange E-visit).  The importance of social distancing was discussed today.  Time:   Today, I have spent 20 minutes with the patient.    Medication Adjustments/Labs and Tests Ordered: Current medicines are reviewed at length with the patient today.  Concerns regarding medicines are outlined above.   Tests Ordered: Orders Placed This Encounter  Procedures   LONG TERM MONITOR (3-14 DAYS)   EKG 12-Lead   Patient Instructions  Medication Instructions:  No changes *If you need a refill on your cardiac medications before your next appointment, please call your pharmacy*   Lab Work: None ordered If you have labs (blood  work) drawn today and your tests are completely normal, you will receive your results only by: MyChart Message (if you have MyChart) OR A paper copy in the mail If you have any lab test that is abnormal or we need to change your treatment, we will call you to review the results.   Testing/Procedures: Christena Deem- Long Term Monitor Instructions  Your physician has requested you wear a ZIO patch monitor for 14 days.  This is a single patch monitor. [employer] supplies one patch monitor per enrollment. Additional stickers are not available. Please do not apply patch if you will be having a Nuclear Stress Test,  Echocardiogram, Cardiac CT, MRI, or Chest Xray during the period you would be wearing the  monitor. The patch cannot be worn during these tests. You cannot remove and re-apply the  ZIO XT patch monitor.  Your ZIO patch monitor will be mailed 3 day USPS to your address on file. It may take 3-5 days  to receive your monitor after you have been enrolled.  Once you have received your monitor, please review the enclosed instructions. Your  monitor  has already been registered assigning a specific monitor serial # to you.  Billing and Patient Assistance Program Information  We have supplied [employer] with any of your insurance information on file for billing purposes. [employer] offers a sliding scale Patient Assistance Program for patients that do not have  insurance, or whose insurance does not completely cover the cost of the ZIO monitor.  You must apply for the Patient Assistance Program to qualify for this discounted rate.  To apply, please call [employer] at [phone], select option 4, select option 2, ask to apply for  Patient Assistance Program. Meredeth Ide will ask your household income, and how many people  are in your household. They will quote your out-of-pocket cost based on that information.  [employer] will also be able to set up a 59-month, interest-free payment plan if needed.  Applying the monitor   Shave hair from upper left chest.  Hold abrader disc by orange tab. Rub abrader in 40 strokes over the upper left chest as  indicated in your monitor instructions.  Clean area with 4 enclosed alcohol pads. Let dry.  Apply patch as indicated in monitor instructions. Patch will be placed under collarbone on left  side of chest with arrow pointing upward.  Rub patch adhesive wings for 2 minutes. Remove white label marked "1". Remove the white  label marked "2". Rub patch adhesive wings for 2 additional minutes.  While looking in a mirror, press and release button in center of patch. A small green light will  flash 3-4 times. This will be your only indicator that the monitor has been turned on.  Do not shower for the first 24 hours. You may shower after the first 24 hours.  Press the button if you feel a symptom. You will hear a small click. Record Date, Time and  Symptom in the Patient Logbook.  When you are ready to remove the patch, follow instructions on the last 2 pages of Patient  Logbook. Stick patch monitor onto the  last page of Patient Logbook.  Place Patient Logbook in the blue and white box. Use locking tab on box and tape box closed  securely. The blue and white box has prepaid postage on it. Please place it in the mailbox as  soon as possible. Your physician should have your test results approximately 7 days after the  monitor has been  mailed back to [facility].  Call [facility] Customer Care at [phone] if you have questions regarding  your ZIO XT patch monitor. Call them immediately if you see an orange light blinking on your  monitor.  If your monitor falls off in less than 4 days, contact our Monitor department at [phone].  If your monitor becomes loose or falls off after 4 days call [employer] at [phone] for  suggestions on securing your monitor    Follow-Up: At [facility], you and your health needs are our priority.  As part of our continuing mission to provide you with exceptional heart care, we have created designated Provider Care Teams.  These Care Teams include your primary Cardiologist (physician) and Advanced Practice Providers (APPs -  Physician Assistants and Nurse Practitioners) who all work together to provide you with the care you need, when you need it.  We recommend signing up for the patient portal called "MyChart".  Sign up information is provided on this After Visit Summary.  MyChart is used to connect with patients for Virtual Visits (Telemedicine).  Patients are able to view lab/test results, encounter notes, upcoming appointments, etc.  Non-urgent messages can be sent to your provider as well.   To learn more about what you can do with MyChart, go to [email or web address].    Your next appointment:   12 month(s)  The format for your next appointment:   In Person  Provider:   Thurmon Fair, MD {   Other Instructions Premature Atrial Contractions [facility])    Signed, Thurmon Fair, MD  01/05/2022 9:43 AM    [facility]

## 2022-01-07 DIAGNOSIS — I491 Atrial premature depolarization: Secondary | ICD-10-CM | POA: Diagnosis not present

## 2022-01-28 DIAGNOSIS — I491 Atrial premature depolarization: Secondary | ICD-10-CM | POA: Diagnosis not present

## 2022-02-04 ENCOUNTER — Telehealth: Payer: Self-pay | Admitting: Cardiovascular Disease

## 2022-02-04 NOTE — Telephone Encounter (Signed)
Called pt to go over his monitor results. He verbalized understanding and states "I did not know anything was going on. I don't ever feel anything." No further questions at this time.  ?

## 2022-02-04 NOTE — Telephone Encounter (Signed)
Patient calling for heart monitor results.  

## 2022-02-19 NOTE — Telephone Encounter (Signed)
No record of a call being placed.  ?

## 2022-02-19 NOTE — Telephone Encounter (Signed)
Patient states he got another call today to go over his monitor results.  ?

## 2022-03-30 DIAGNOSIS — H34812 Central retinal vein occlusion, left eye, with macular edema: Secondary | ICD-10-CM | POA: Diagnosis not present

## 2022-06-22 DIAGNOSIS — H43813 Vitreous degeneration, bilateral: Secondary | ICD-10-CM | POA: Diagnosis not present

## 2022-06-22 DIAGNOSIS — H35361 Drusen (degenerative) of macula, right eye: Secondary | ICD-10-CM | POA: Diagnosis not present

## 2022-06-22 DIAGNOSIS — H34812 Central retinal vein occlusion, left eye, with macular edema: Secondary | ICD-10-CM | POA: Diagnosis not present

## 2022-07-29 DIAGNOSIS — M1711 Unilateral primary osteoarthritis, right knee: Secondary | ICD-10-CM | POA: Diagnosis not present

## 2022-08-16 DIAGNOSIS — G8929 Other chronic pain: Secondary | ICD-10-CM | POA: Diagnosis not present

## 2022-08-16 DIAGNOSIS — M25561 Pain in right knee: Secondary | ICD-10-CM | POA: Diagnosis not present

## 2022-08-16 DIAGNOSIS — M1711 Unilateral primary osteoarthritis, right knee: Secondary | ICD-10-CM | POA: Diagnosis not present

## 2022-09-02 DIAGNOSIS — I10 Essential (primary) hypertension: Secondary | ICD-10-CM | POA: Diagnosis not present

## 2022-09-02 DIAGNOSIS — N183 Chronic kidney disease, stage 3 unspecified: Secondary | ICD-10-CM | POA: Diagnosis not present

## 2022-09-02 DIAGNOSIS — Z0000 Encounter for general adult medical examination without abnormal findings: Secondary | ICD-10-CM | POA: Diagnosis not present

## 2022-09-02 DIAGNOSIS — E7800 Pure hypercholesterolemia, unspecified: Secondary | ICD-10-CM | POA: Diagnosis not present

## 2022-09-28 DIAGNOSIS — H34812 Central retinal vein occlusion, left eye, with macular edema: Secondary | ICD-10-CM | POA: Diagnosis not present

## 2022-09-28 DIAGNOSIS — H35361 Drusen (degenerative) of macula, right eye: Secondary | ICD-10-CM | POA: Diagnosis not present

## 2022-09-28 DIAGNOSIS — H43813 Vitreous degeneration, bilateral: Secondary | ICD-10-CM | POA: Diagnosis not present

## 2022-09-28 DIAGNOSIS — H35033 Hypertensive retinopathy, bilateral: Secondary | ICD-10-CM | POA: Diagnosis not present

## 2022-10-20 DIAGNOSIS — Z1212 Encounter for screening for malignant neoplasm of rectum: Secondary | ICD-10-CM | POA: Diagnosis not present

## 2022-12-14 DIAGNOSIS — H25813 Combined forms of age-related cataract, bilateral: Secondary | ICD-10-CM | POA: Diagnosis not present

## 2022-12-14 DIAGNOSIS — H5203 Hypermetropia, bilateral: Secondary | ICD-10-CM | POA: Diagnosis not present

## 2022-12-14 DIAGNOSIS — H3500 Unspecified background retinopathy: Secondary | ICD-10-CM | POA: Diagnosis not present

## 2023-01-06 ENCOUNTER — Ambulatory Visit: Payer: PPO | Attending: Cardiovascular Disease | Admitting: Cardiovascular Disease

## 2023-01-06 ENCOUNTER — Encounter: Payer: Self-pay | Admitting: Cardiovascular Disease

## 2023-01-06 VITALS — BP 130/81 | HR 57 | Ht 68.0 in | Wt 171.0 lb

## 2023-01-06 DIAGNOSIS — E7800 Pure hypercholesterolemia, unspecified: Secondary | ICD-10-CM | POA: Diagnosis not present

## 2023-01-06 DIAGNOSIS — I2510 Atherosclerotic heart disease of native coronary artery without angina pectoris: Secondary | ICD-10-CM

## 2023-01-06 DIAGNOSIS — I491 Atrial premature depolarization: Secondary | ICD-10-CM

## 2023-01-06 NOTE — Patient Instructions (Signed)
Medication Instructions:  No changes *If you need a refill on your cardiac medications before your next appointment, please call your pharmacy*  Follow-Up: At [facility], you and your health needs are our priority.  As part of our continuing mission to provide you with exceptional heart care, we have created designated Provider Care Teams.  These Care Teams include your primary Cardiologist (physician) and Advanced Practice Providers (APPs -  Physician Assistants and Nurse Practitioners) who all work together to provide you with the care you need, when you need it.  We recommend signing up for the patient portal called "MyChart".  Sign up information is provided on this After Visit Summary.  MyChart is used to connect with patients for Virtual Visits (Telemedicine).  Patients are able to view lab/test results, encounter notes, upcoming appointments, etc.  Non-urgent messages can be sent to your provider as well.   To learn more about what you can do with MyChart, go to [email or web address].    Your next appointment:   1 year(s)  Provider:   Sanda Klein, MD

## 2023-01-06 NOTE — Progress Notes (Signed)
Cardiology office note    Evaluation Performed:  Follow-up visit  Date:  01/06/2023   ID:  Stephen Morales, DOB 1946/10/31, MRN [id number]  PCP:  Vernie Shanks, MD (Inactive)  Cardiologist:  Sanda Klein, MD  Electrophysiologist:  None   Chief Complaint: Coronary artery disease follow-up  History of Present Illness:    Stephen Morales is a 77 y.o. male, retired Company secretary, with a history of coronary artery disease s/p multivessel bypass surgery in 2007 (LIMA to LAD, sequential SVG to OM1-OM 2, sequential SVG to PDA, PL 1, PL 2), without subsequent coronary events.  He has treated hypercholesterolemia.    From a physical point of view he is doing great.  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  He has been unable to walk as much as in the past due to a right knee problem but he exercises daily he does 70 push-ups of 120 arm curls and 50 squats every morning.  He does not have any chest pain or shortness of breath with these activities or during the day.  He denies dizziness, syncope, palpitations, leg edema, claudication.  He is a little despondent since he lost his last sibling in November.  He has macular edema in the left eye that has interfered with vision (he has a history of central retinal vein occlusion) and is getting injections from a retina specialist.  As before, on exam he has fairly frequent ectopy on ECG these are again demonstrated to be isolated PACs but he remains unaware of them.  His pre-bypass angina pectoris with a sensation of "electrical current" across his anterior chest with numbness in his left forearm.  This has not recurred since surgery.    Past Medical History:  Diagnosis Date   Bradycardia    CAD (coronary artery disease)    Creatinine elevation    09/19/12 1.4   Dyslipidemia    Hypotension, unspecified    borderline   S/P CABG x 6 01/24/06   Past Surgical History:  Procedure  Laterality Date   CARDIAC CATHETERIZATION  01/20/06   severe 3 vessel disease   CORONARY ARTERY BYPASS GRAFT  01/24/06   LIMA graft to LAD,seq SVG to the first and second obtuse marg. branches of the left CX, seq SVG to the post descending, first posterolateral and second posterolateral branches of the RCA   TESTICLE SURGERY       Current Meds  Medication Sig   aspirin 81 MG tablet Take 81 mg by mouth daily.   atorvastatin (LIPITOR) 20 MG tablet TAKE 1 TABLET BY MOUTH ONCE DAILY   B Complex-C (SUPER B COMPLEX PO) Take 1 capsule by mouth daily.   cetirizine (ZYRTEC) 10 MG tablet Take 10 mg by mouth daily.   Coenzyme Q10 (COQ10) 200 MG CAPS Take 1 capsule by mouth daily.   cyanocobalamin 500 MCG tablet Take 500 mcg by mouth daily.   fluticasone (FLONASE) 50 MCG/ACT nasal spray Place 2 sprays into the nose daily.   Magnesium 200 MG TABS 1 capsule with a meal   Omega-3 Fatty Acids (FISH OIL) 1200 MG CAPS Take 2 capsules by mouth daily.     Allergies:   Morphine and related, Oxycontin [oxycodone hcl], and Zocor [simvastatin]   Social History   Tobacco Use   Smoking status: Never   Smokeless tobacco: Never  Substance Use Topics   Alcohol use: No   Drug  use: No     Family Hx: The patient's family history includes Hypertension in his mother.  ROS:   Please see the history of present illness.    All other systems are reviewed and are negative.  Prior CV studies:   The following studies were reviewed today:  01/28/2022 arrhythmia monitor:  The dominant rhythm is normal sinus rhythm, with normal circadian rhythm variation. There are frequent premature atrial contractions. There are rare episodes of sustained, but brief atrial tachycardia. The longest episode lasted for just over 22 seconds.. Transient aberrant conduction is seen during the episodes of atrial tachycardia. The tachycardia events appear to have a long RP mechanism, most consistent with paroxysmal atrial tachycardia.  They occur at rest, including at night, and did not appear to be associated with physical activity. Atrial fibrillation is not seen. There is no evidence of severe bradycardia, atrioventricular block, or meaningful ventricular arrhythmia. Only 1 patient triggered recording is submitted. This shows normal sinus rhythm   Abnormal event monitor due to the occurrence of frequent premature atrial contractions and occasional episodes of sustained but brief ectopic atrial tachycardia.  There is no evidence of atrial fibrillation.  No correlation with the patient's symptoms is identified.    Labs/Other Tests and Data Reviewed:    EKG: Performed today, shows sinus bradycardia with a single PAC, no repolarization abnormalities.  Questionable left atrial abnormality.  QTc uncorrected 410 ms.  Recent Labs: 12/15/2020 Creatinine 1.32, potassium 5.0, normal liver function tests  Recent Lipid Panel 12/15/2020 Cholesterol 135, HDL 61, LDL 60, triglycerides 68  09/02/2022 Cholesterol 145, HDL 61, LDL 69, triglycerides 79  Wt Readings from Last 3 Encounters:  01/06/23 171 lb (77.6 kg)  01/05/22 166 lb (75.3 kg)  03/06/21 168 lb 3.2 oz (76.3 kg)     Objective:    Vital Signs:  BP 130/81   Pulse (!) 57   Ht '5\' 8"'$  (1.727 m)   Wt 171 lb (77.6 kg)   SpO2 95%   BMI 26.00 kg/m      General: Alert, oriented x3, no distress, appears very fit and younger than stated age Head: no evidence of trauma, PERRL, EOMI, no exophtalmos or lid lag, no myxedema, no xanthelasma; normal ears, nose and oropharynx Neck: normal jugular venous pulsations and no hepatojugular reflux; brisk carotid pulses without delay and no carotid bruits Chest: clear to auscultation, no signs of consolidation by percussion or palpation, normal fremitus, symmetrical and full respiratory excursions Cardiovascular: normal position and quality of the apical impulse, regular rhythm with occasional ectopic beats, normal first and second  heart sounds, no murmurs, rubs or gallops Abdomen: no tenderness or distention, no masses by palpation, no abnormal pulsatility or arterial bruits, normal bowel sounds, no hepatosplenomegaly Extremities: no clubbing, cyanosis or edema; 2+ radial, ulnar and brachial pulses bilaterally; 2+ right femoral, posterior tibial and dorsalis pedis pulses; 2+ left femoral, posterior tibial and dorsalis pedis pulses; no subclavian or femoral bruits Neurological: grossly nonfocal Psych: Normal mood and affect    ASSESSMENT & PLAN:    1. Coronary artery disease involving native coronary artery of native heart without angina pectoris   2. PAC (premature atrial contraction)   3. Pure hypercholesterolemia         CAD: Doing great 17 years after bypass surgery without any recurrent problems.  Asymptomatic despite a very active lifestyle and regular physical exercise.  Committed to a healthy diet.  Excellent risk factor management. HLP: All lipid parameters remain in target range.  Continue statin. PACs: Frequent on exam, but asymptomatic.  Previous monitor did not show evidence of atrial fibrillation.   COVID-19 Education: The signs and symptoms of COVID-19 were discussed with the patient and how to seek care for testing (follow up with PCP or arrange E-visit).  The importance of social distancing was discussed today.  Time:   Today, I have spent 20 minutes with the patient.    Medication Adjustments/Labs and Tests Ordered: Current medicines are reviewed at length with the patient today.  Concerns regarding medicines are outlined above.   Tests Ordered: Orders Placed This Encounter  Procedures   EKG 12-Lead   Patient Instructions  Medication Instructions:  No changes *If you need a refill on your cardiac medications before your next appointment, please call your pharmacy*  Follow-Up: At [facility], you and your health needs are our priority.  As part of our continuing mission to  provide you with exceptional heart care, we have created designated Provider Care Teams.  These Care Teams include your primary Cardiologist (physician) and Advanced Practice Providers (APPs -  Physician Assistants and Nurse Practitioners) who all work together to provide you with the care you need, when you need it.  We recommend signing up for the patient portal called "MyChart".  Sign up information is provided on this After Visit Summary.  MyChart is used to connect with patients for Virtual Visits (Telemedicine).  Patients are able to view lab/test results, encounter notes, upcoming appointments, etc.  Non-urgent messages can be sent to your provider as well.   To learn more about what you can do with MyChart, go to [email or web address].    Your next appointment:   1 year(s)  Provider:   Sanda Klein, MD       Signed, Sanda Klein, MD  01/06/2023 8:32 AM    [place]

## 2023-01-10 DIAGNOSIS — H34812 Central retinal vein occlusion, left eye, with macular edema: Secondary | ICD-10-CM | POA: Diagnosis not present

## 2023-02-15 ENCOUNTER — Telehealth: Payer: Self-pay | Admitting: Cardiovascular Disease

## 2023-02-15 DIAGNOSIS — M25561 Pain in right knee: Secondary | ICD-10-CM | POA: Diagnosis not present

## 2023-02-15 DIAGNOSIS — G8929 Other chronic pain: Secondary | ICD-10-CM | POA: Diagnosis not present

## 2023-02-15 NOTE — Telephone Encounter (Signed)
Paper Work Dropped Off: Preoperative Risk Assessment   Date: 02/15/2023  Location of paper:  Provider Mailbox

## 2023-02-17 NOTE — Telephone Encounter (Signed)
Called pt and told him that he could come and sign this paper or call the [facility] to make sure that it is ok to just receive verbal confirmation to send pt information.   Pt will come by today or tomorrow to sign document. Will leave document at the front for him.

## 2023-02-17 NOTE — Telephone Encounter (Signed)
I Let the pt know that there is no signature on the paperwork that was dropped off. I was given verbal confirmation over the phone that the pt wants this paperwork faxed for his upcoming procedure; needs clearance from Cardiologist.   I asked Shirlee More- Clinical supervisor if this was acceptable, awaiting response.

## 2023-02-18 ENCOUNTER — Telehealth: Payer: Self-pay | Admitting: Cardiovascular Disease

## 2023-02-18 NOTE — Telephone Encounter (Signed)
Paper Work Dropped Off: Preoperative Risk Assessment  Date: 02/18/2023  Location of paper:  Provider Mailbox

## 2023-02-21 NOTE — Telephone Encounter (Signed)
Preoperative Risk assessment, last office visit, and last EKG faxed to [facility]

## 2023-03-02 DIAGNOSIS — I25810 Atherosclerosis of coronary artery bypass graft(s) without angina pectoris: Secondary | ICD-10-CM | POA: Diagnosis not present

## 2023-03-02 DIAGNOSIS — N183 Chronic kidney disease, stage 3 unspecified: Secondary | ICD-10-CM | POA: Diagnosis not present

## 2023-03-02 DIAGNOSIS — M25561 Pain in right knee: Secondary | ICD-10-CM | POA: Diagnosis not present

## 2023-03-02 DIAGNOSIS — Z6826 Body mass index (BMI) 26.0-26.9, adult: Secondary | ICD-10-CM | POA: Diagnosis not present

## 2023-03-02 DIAGNOSIS — Z01818 Encounter for other preprocedural examination: Secondary | ICD-10-CM | POA: Diagnosis not present

## 2023-03-02 DIAGNOSIS — I10 Essential (primary) hypertension: Secondary | ICD-10-CM | POA: Diagnosis not present

## 2023-04-11 DIAGNOSIS — H34812 Central retinal vein occlusion, left eye, with macular edema: Secondary | ICD-10-CM | POA: Diagnosis not present

## 2023-04-11 DIAGNOSIS — H35033 Hypertensive retinopathy, bilateral: Secondary | ICD-10-CM | POA: Diagnosis not present

## 2023-04-11 DIAGNOSIS — H43813 Vitreous degeneration, bilateral: Secondary | ICD-10-CM | POA: Diagnosis not present

## 2023-04-11 DIAGNOSIS — H35361 Drusen (degenerative) of macula, right eye: Secondary | ICD-10-CM | POA: Diagnosis not present

## 2023-07-26 DIAGNOSIS — H34812 Central retinal vein occlusion, left eye, with macular edema: Secondary | ICD-10-CM | POA: Diagnosis not present

## 2023-09-01 ENCOUNTER — Encounter: Payer: Self-pay | Admitting: Cardiovascular Disease

## 2023-11-01 DIAGNOSIS — Z01818 Encounter for other preprocedural examination: Secondary | ICD-10-CM | POA: Diagnosis not present

## 2023-11-01 DIAGNOSIS — Z6826 Body mass index (BMI) 26.0-26.9, adult: Secondary | ICD-10-CM | POA: Diagnosis not present

## 2023-11-01 DIAGNOSIS — I25810 Atherosclerosis of coronary artery bypass graft(s) without angina pectoris: Secondary | ICD-10-CM | POA: Diagnosis not present

## 2023-11-01 DIAGNOSIS — N1831 Chronic kidney disease, stage 3a: Secondary | ICD-10-CM | POA: Diagnosis not present

## 2023-11-01 DIAGNOSIS — M25561 Pain in right knee: Secondary | ICD-10-CM | POA: Diagnosis not present

## 2023-11-01 DIAGNOSIS — N183 Chronic kidney disease, stage 3 unspecified: Secondary | ICD-10-CM | POA: Diagnosis not present

## 2023-11-03 ENCOUNTER — Telehealth: Payer: Self-pay

## 2023-11-03 NOTE — Telephone Encounter (Signed)
 Will update all parties involved pt has appt 11/11/23 with Joni Reining, DNP.

## 2023-11-03 NOTE — Telephone Encounter (Signed)
   Pre-operative Risk Assessment    Patient Name: Stephen Morales  DOB: 11/27/1945 MRN: [id number]   Date of last office visit: 01/06/23 with Dr. Francyne Date of next office visit: 11/11/23 with Lamarr Satterfield for Pre-op clearance    Request for Surgical Clearance    Procedure:   Right Total Knee Arthroplasty   Date of Surgery:  Clearance TBD                                 Surgeon:  Dr. Garnette Raman  Surgeon's Group or Practice Name:  [facility]: Sports Medicine and Joint Replacement Phone number:  [phone] Fax number:  Office: [phone] SCG: [phone]   Type of Clearance Requested:   - Medical  - Pharmacy:  Hold Aspirin Not indicated    Type of Anesthesia:  Spinal   Additional requests/questions:    [name]   11/03/2023, 1:33 PM

## 2023-11-03 NOTE — Telephone Encounter (Signed)
 Primary Cardiologist:Mihai Croitoru, MD  Chart reviewed as part of pre-operative protocol coverage. Because of Stephen Morales past medical history and time since last visit, he/she will require a follow-up visit in order to better assess preoperative cardiovascular risk.  Pre-op covering staff: - Patient already has an appointment for clearance with Lamarr Satterfield, NP on 11/11/2023 at which time clearance will be addressed. Appointment notes have been updated.  - Please contact requesting surgeon's office via preferred method (i.e, phone, fax) to inform them of need for appointment prior to surgery.  Per office protocol and pending no concerning cardiac symptoms at time of appointment, he may hold aspirin for 5-7 days prior to procedure and should resume as soon as hemodynamically stable postoperatively.   Stephen EMERSON Bane, NP-C  11/03/2023, 2:29 PM [address] Office [phone] Fax [phone]

## 2023-11-07 NOTE — Progress Notes (Signed)
 Cardiology Office Note:  .   Date:  11/11/2023  ID:  Stephen Morales, DOB [birth year], MRN [id number] PCP: Cyrena Gwenn SQUIBB, MD (Inactive)  [facility] Providers Cardiologist:  Jerel Balding, MD  }   History of Present Illness: .   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 He has treated hypercholesterolemia last seen by Dr. Balding on 01/06/2023.  No changes are made in his medication regimen.  He is here for Right Total Knee Arthroplasty with Dr. 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  He does several funerals, works at a funeral home, pastors, and also works at [employer].  He works out at [employer] every day, and also does 160 push-ups at home every day.  He is able to climb stairs, walk across uneven ground although this is painful to his knee.  He denies any chest pain, palpitations, dyspnea on exertion, or profound fatigue associated with all of his activities.  He is medically compliant.  Review of EKG reveals isolated PACs, this is unchanged from EKGs dating back 3 years, the patient is cardiac unaware.  ROS: As above otherwise negative  Studies Reviewed: SABRA    EKG Interpretation Date/Time:  Friday November 11 2023 08:41:13 EST Ventricular Rate:  78 PR Interval:  140 QRS Duration:  88 QT Interval:  364 QTC Calculation: 414 R Axis:   64  Text Interpretation: Sinus rhythm with marked sinus arrhythmia When compared with ECG of 25-Jan-2006 07:49, Fusion complexes are no longer Present ST no longer elevated in Anterolateral leads Confirmed by Jerilynn Collar [phone]) on 11/11/2023 8:54:36 AM    Physical Exam:   VS:  BP 118/80 (BP Location: Left Arm, Patient Position: Sitting, Cuff Size: Normal)   Pulse 78   Ht 5' 8 (1.727 m)   Wt 167 lb (75.8  kg)   BMI 25.39 kg/m    Wt Readings from Last 3 Encounters:  11/11/23 167 lb (75.8 kg)  01/06/23 171 lb (77.6 kg)  01/05/22 166 lb (75.3 kg)    GEN: Well nourished, well developed in no acute distress NECK: No JVD; No carotid bruits CARDIAC: IRRR, no murmurs, rubs, gallops RESPIRATORY:  Clear to auscultation without rales, wheezing or rhonchi  ABDOMEN: Soft, non-tender, non-distended EXTREMITIES:  No edema; No deformity   ASSESSMENT AND PLAN: .    Pre-Operative Cardiac Clearance:  According to the Revised Cardiac Risk Index (RCRI), his Perioperative Risk of Major Cardiac Event is (%): 0.9  His Functional Capacity in METs is: 7.99 according to the Duke Activity Status Index (DASI).   Per office protocol, if patient is without any new symptoms or concerns at the time of their virtual visit, he/she may hold ASA for 7 days prior to procedure. Please resume ASA as soon as possible postprocedure, at the discretion of the surgeon.    Therefore, based on ACC/AHA guidelines, patient would be at acceptable risk for the planned procedure without further cardiovascular testing. I will route this recommendation to the requesting party via Epic fax function.     2.  CAD: History of CABG in 2007 x 5.  He is doing extremely well from a cardiac standpoint.  Working out daily, on his feet walking a good bit,  working 3 jobs without excessive fatigue or chest pain dizziness or palpitations.  Strong for a man of his age.  Continue secondary prevention/management with blood pressure control, statin therapy, purposeful exercise as he is doing, and weight management as he is doing.  No changes or new testing planned unless he becomes symptomatic.  3.  Hypercholesterolemia: He remains on atorvastatin  20 mg daily with goal of LDL less than 70.  Labs are being followed by PCP.  He offers no complaints of myalgia pain.  He is also using co-Q10 on occasion for overall joint health.   Signed, Lamarr HERO. Jerilynn CHOL, ANP, AACC

## 2023-11-11 ENCOUNTER — Encounter: Payer: Self-pay | Admitting: Adult Health

## 2023-11-11 ENCOUNTER — Ambulatory Visit: Payer: PPO | Attending: Adult Health | Admitting: Adult Health

## 2023-11-11 VITALS — BP 118/80 | HR 78 | Ht 68.0 in | Wt 167.0 lb

## 2023-11-11 DIAGNOSIS — E7800 Pure hypercholesterolemia, unspecified: Secondary | ICD-10-CM | POA: Diagnosis not present

## 2023-11-11 DIAGNOSIS — I491 Atrial premature depolarization: Secondary | ICD-10-CM

## 2023-11-11 DIAGNOSIS — I2510 Atherosclerotic heart disease of native coronary artery without angina pectoris: Secondary | ICD-10-CM | POA: Diagnosis not present

## 2023-11-11 DIAGNOSIS — Z01818 Encounter for other preprocedural examination: Secondary | ICD-10-CM | POA: Diagnosis not present

## 2023-11-11 NOTE — Patient Instructions (Signed)
 Medication Instructions:  No Changes *If you need a refill on your cardiac medications before your next appointment, please call your pharmacy*   Lab Work: No Labs If you have labs (blood work) drawn today and your tests are completely normal, you will receive your results only by: MyChart Message (if you have MyChart) OR A paper copy in the mail If you have any lab test that is abnormal or we need to change your treatment, we will call you to review the results.   Testing/Procedures: No Testing   Follow-Up: At [facility], you and your health needs are our priority.  As part of our continuing mission to provide you with exceptional heart care, we have created designated Provider Care Teams.  These Care Teams include your primary Cardiologist (physician) and Advanced Practice Providers (APPs -  Physician Assistants and Nurse Practitioners) who all work together to provide you with the care you need, when you need it.  We recommend signing up for the patient portal called "MyChart".  Sign up information is provided on this After Visit Summary.  MyChart is used to connect with patients for Virtual Visits (Telemedicine).  Patients are able to view lab/test results, encounter notes, upcoming appointments, etc.  Non-urgent messages can be sent to your provider as well.   To learn more about what you can do with MyChart, go to [email or web address].    Your next appointment:   1 year(s)  Provider:   Thurmon Fair, MD

## 2024-01-04 DIAGNOSIS — G8929 Other chronic pain: Secondary | ICD-10-CM | POA: Diagnosis not present

## 2024-01-04 DIAGNOSIS — M25561 Pain in right knee: Secondary | ICD-10-CM | POA: Diagnosis not present

## 2024-01-16 DIAGNOSIS — G8918 Other acute postprocedural pain: Secondary | ICD-10-CM | POA: Diagnosis not present

## 2024-01-16 DIAGNOSIS — M25761 Osteophyte, right knee: Secondary | ICD-10-CM | POA: Diagnosis not present

## 2024-01-16 DIAGNOSIS — M1711 Unilateral primary osteoarthritis, right knee: Secondary | ICD-10-CM | POA: Diagnosis not present

## 2024-01-16 DIAGNOSIS — Z96651 Presence of right artificial knee joint: Secondary | ICD-10-CM | POA: Diagnosis not present

## 2024-01-26 DIAGNOSIS — Z96651 Presence of right artificial knee joint: Secondary | ICD-10-CM | POA: Diagnosis not present

## 2024-01-26 DIAGNOSIS — M25461 Effusion, right knee: Secondary | ICD-10-CM | POA: Diagnosis not present

## 2024-01-26 DIAGNOSIS — M25061 Hemarthrosis, right knee: Secondary | ICD-10-CM | POA: Diagnosis not present

## 2024-01-26 DIAGNOSIS — M25661 Stiffness of right knee, not elsewhere classified: Secondary | ICD-10-CM | POA: Diagnosis not present

## 2024-01-26 DIAGNOSIS — M25561 Pain in right knee: Secondary | ICD-10-CM | POA: Diagnosis not present

## 2024-01-26 DIAGNOSIS — Z7409 Other reduced mobility: Secondary | ICD-10-CM | POA: Diagnosis not present

## 2024-01-30 DIAGNOSIS — Z7409 Other reduced mobility: Secondary | ICD-10-CM | POA: Diagnosis not present

## 2024-01-30 DIAGNOSIS — M25561 Pain in right knee: Secondary | ICD-10-CM | POA: Diagnosis not present

## 2024-01-30 DIAGNOSIS — Z96651 Presence of right artificial knee joint: Secondary | ICD-10-CM | POA: Diagnosis not present

## 2024-01-30 DIAGNOSIS — M25661 Stiffness of right knee, not elsewhere classified: Secondary | ICD-10-CM | POA: Diagnosis not present

## 2024-01-30 DIAGNOSIS — M25461 Effusion, right knee: Secondary | ICD-10-CM | POA: Diagnosis not present

## 2024-01-30 DIAGNOSIS — G8929 Other chronic pain: Secondary | ICD-10-CM | POA: Diagnosis not present

## 2024-02-02 DIAGNOSIS — M25661 Stiffness of right knee, not elsewhere classified: Secondary | ICD-10-CM | POA: Diagnosis not present

## 2024-02-02 DIAGNOSIS — M25461 Effusion, right knee: Secondary | ICD-10-CM | POA: Diagnosis not present

## 2024-02-02 DIAGNOSIS — Z96651 Presence of right artificial knee joint: Secondary | ICD-10-CM | POA: Diagnosis not present

## 2024-02-02 DIAGNOSIS — M25561 Pain in right knee: Secondary | ICD-10-CM | POA: Diagnosis not present

## 2024-02-02 DIAGNOSIS — Z7409 Other reduced mobility: Secondary | ICD-10-CM | POA: Diagnosis not present

## 2024-02-07 DIAGNOSIS — M25661 Stiffness of right knee, not elsewhere classified: Secondary | ICD-10-CM | POA: Diagnosis not present

## 2024-02-07 DIAGNOSIS — M25561 Pain in right knee: Secondary | ICD-10-CM | POA: Diagnosis not present

## 2024-02-07 DIAGNOSIS — Z96651 Presence of right artificial knee joint: Secondary | ICD-10-CM | POA: Diagnosis not present

## 2024-02-07 DIAGNOSIS — Z7409 Other reduced mobility: Secondary | ICD-10-CM | POA: Diagnosis not present

## 2024-02-07 DIAGNOSIS — M25461 Effusion, right knee: Secondary | ICD-10-CM | POA: Diagnosis not present

## 2024-02-10 DIAGNOSIS — M25461 Effusion, right knee: Secondary | ICD-10-CM | POA: Diagnosis not present

## 2024-02-10 DIAGNOSIS — Z96651 Presence of right artificial knee joint: Secondary | ICD-10-CM | POA: Diagnosis not present

## 2024-02-10 DIAGNOSIS — M25561 Pain in right knee: Secondary | ICD-10-CM | POA: Diagnosis not present

## 2024-02-10 DIAGNOSIS — Z7409 Other reduced mobility: Secondary | ICD-10-CM | POA: Diagnosis not present

## 2024-02-10 DIAGNOSIS — M25661 Stiffness of right knee, not elsewhere classified: Secondary | ICD-10-CM | POA: Diagnosis not present

## 2024-02-14 DIAGNOSIS — M25661 Stiffness of right knee, not elsewhere classified: Secondary | ICD-10-CM | POA: Diagnosis not present

## 2024-02-14 DIAGNOSIS — Z96651 Presence of right artificial knee joint: Secondary | ICD-10-CM | POA: Diagnosis not present

## 2024-02-14 DIAGNOSIS — M25561 Pain in right knee: Secondary | ICD-10-CM | POA: Diagnosis not present

## 2024-02-14 DIAGNOSIS — Z7409 Other reduced mobility: Secondary | ICD-10-CM | POA: Diagnosis not present

## 2024-02-14 DIAGNOSIS — M25461 Effusion, right knee: Secondary | ICD-10-CM | POA: Diagnosis not present

## 2024-02-16 DIAGNOSIS — M25561 Pain in right knee: Secondary | ICD-10-CM | POA: Diagnosis not present

## 2024-02-16 DIAGNOSIS — M25461 Effusion, right knee: Secondary | ICD-10-CM | POA: Diagnosis not present

## 2024-02-16 DIAGNOSIS — Z96651 Presence of right artificial knee joint: Secondary | ICD-10-CM | POA: Diagnosis not present

## 2024-02-16 DIAGNOSIS — Z7409 Other reduced mobility: Secondary | ICD-10-CM | POA: Diagnosis not present

## 2024-02-16 DIAGNOSIS — M25661 Stiffness of right knee, not elsewhere classified: Secondary | ICD-10-CM | POA: Diagnosis not present

## 2024-02-21 DIAGNOSIS — M25561 Pain in right knee: Secondary | ICD-10-CM | POA: Diagnosis not present

## 2024-02-21 DIAGNOSIS — Z7409 Other reduced mobility: Secondary | ICD-10-CM | POA: Diagnosis not present

## 2024-02-21 DIAGNOSIS — M25461 Effusion, right knee: Secondary | ICD-10-CM | POA: Diagnosis not present

## 2024-02-21 DIAGNOSIS — Z96651 Presence of right artificial knee joint: Secondary | ICD-10-CM | POA: Diagnosis not present

## 2024-02-21 DIAGNOSIS — M25661 Stiffness of right knee, not elsewhere classified: Secondary | ICD-10-CM | POA: Diagnosis not present

## 2024-02-23 DIAGNOSIS — Z7409 Other reduced mobility: Secondary | ICD-10-CM | POA: Diagnosis not present

## 2024-02-23 DIAGNOSIS — Z96651 Presence of right artificial knee joint: Secondary | ICD-10-CM | POA: Diagnosis not present

## 2024-02-23 DIAGNOSIS — M25461 Effusion, right knee: Secondary | ICD-10-CM | POA: Diagnosis not present

## 2024-02-23 DIAGNOSIS — M25561 Pain in right knee: Secondary | ICD-10-CM | POA: Diagnosis not present

## 2024-02-23 DIAGNOSIS — M25661 Stiffness of right knee, not elsewhere classified: Secondary | ICD-10-CM | POA: Diagnosis not present

## 2024-02-29 DIAGNOSIS — Z96651 Presence of right artificial knee joint: Secondary | ICD-10-CM | POA: Diagnosis not present

## 2024-02-29 DIAGNOSIS — M24661 Ankylosis, right knee: Secondary | ICD-10-CM | POA: Diagnosis not present

## 2024-02-29 DIAGNOSIS — T8482XA Fibrosis due to internal orthopedic prosthetic devices, implants and grafts, initial encounter: Secondary | ICD-10-CM | POA: Diagnosis not present

## 2024-03-01 DIAGNOSIS — Z96651 Presence of right artificial knee joint: Secondary | ICD-10-CM | POA: Diagnosis not present

## 2024-03-01 DIAGNOSIS — Z7409 Other reduced mobility: Secondary | ICD-10-CM | POA: Diagnosis not present

## 2024-03-01 DIAGNOSIS — M25461 Effusion, right knee: Secondary | ICD-10-CM | POA: Diagnosis not present

## 2024-03-01 DIAGNOSIS — M25561 Pain in right knee: Secondary | ICD-10-CM | POA: Diagnosis not present

## 2024-03-01 DIAGNOSIS — M25661 Stiffness of right knee, not elsewhere classified: Secondary | ICD-10-CM | POA: Diagnosis not present

## 2024-03-02 DIAGNOSIS — Z7409 Other reduced mobility: Secondary | ICD-10-CM | POA: Diagnosis not present

## 2024-03-02 DIAGNOSIS — Z96651 Presence of right artificial knee joint: Secondary | ICD-10-CM | POA: Diagnosis not present

## 2024-03-02 DIAGNOSIS — M25461 Effusion, right knee: Secondary | ICD-10-CM | POA: Diagnosis not present

## 2024-03-02 DIAGNOSIS — M25661 Stiffness of right knee, not elsewhere classified: Secondary | ICD-10-CM | POA: Diagnosis not present

## 2024-03-02 DIAGNOSIS — M25561 Pain in right knee: Secondary | ICD-10-CM | POA: Diagnosis not present

## 2024-03-05 DIAGNOSIS — M25461 Effusion, right knee: Secondary | ICD-10-CM | POA: Diagnosis not present

## 2024-03-05 DIAGNOSIS — Z7409 Other reduced mobility: Secondary | ICD-10-CM | POA: Diagnosis not present

## 2024-03-05 DIAGNOSIS — M25561 Pain in right knee: Secondary | ICD-10-CM | POA: Diagnosis not present

## 2024-03-05 DIAGNOSIS — Z96651 Presence of right artificial knee joint: Secondary | ICD-10-CM | POA: Diagnosis not present

## 2024-03-05 DIAGNOSIS — M25661 Stiffness of right knee, not elsewhere classified: Secondary | ICD-10-CM | POA: Diagnosis not present

## 2024-03-05 DIAGNOSIS — H34812 Central retinal vein occlusion, left eye, with macular edema: Secondary | ICD-10-CM | POA: Diagnosis not present

## 2024-03-07 DIAGNOSIS — M25661 Stiffness of right knee, not elsewhere classified: Secondary | ICD-10-CM | POA: Diagnosis not present

## 2024-03-07 DIAGNOSIS — M25561 Pain in right knee: Secondary | ICD-10-CM | POA: Diagnosis not present

## 2024-03-07 DIAGNOSIS — M25461 Effusion, right knee: Secondary | ICD-10-CM | POA: Diagnosis not present

## 2024-03-07 DIAGNOSIS — Z96651 Presence of right artificial knee joint: Secondary | ICD-10-CM | POA: Diagnosis not present

## 2024-03-07 DIAGNOSIS — Z7409 Other reduced mobility: Secondary | ICD-10-CM | POA: Diagnosis not present

## 2024-03-09 DIAGNOSIS — M25461 Effusion, right knee: Secondary | ICD-10-CM | POA: Diagnosis not present

## 2024-03-09 DIAGNOSIS — M25561 Pain in right knee: Secondary | ICD-10-CM | POA: Diagnosis not present

## 2024-03-09 DIAGNOSIS — Z96651 Presence of right artificial knee joint: Secondary | ICD-10-CM | POA: Diagnosis not present

## 2024-03-09 DIAGNOSIS — M25661 Stiffness of right knee, not elsewhere classified: Secondary | ICD-10-CM | POA: Diagnosis not present

## 2024-03-09 DIAGNOSIS — Z7409 Other reduced mobility: Secondary | ICD-10-CM | POA: Diagnosis not present

## 2024-03-12 DIAGNOSIS — Z7409 Other reduced mobility: Secondary | ICD-10-CM | POA: Diagnosis not present

## 2024-03-12 DIAGNOSIS — Z96651 Presence of right artificial knee joint: Secondary | ICD-10-CM | POA: Diagnosis not present

## 2024-03-12 DIAGNOSIS — M25561 Pain in right knee: Secondary | ICD-10-CM | POA: Diagnosis not present

## 2024-03-12 DIAGNOSIS — M25461 Effusion, right knee: Secondary | ICD-10-CM | POA: Diagnosis not present

## 2024-03-12 DIAGNOSIS — M25661 Stiffness of right knee, not elsewhere classified: Secondary | ICD-10-CM | POA: Diagnosis not present

## 2024-03-15 DIAGNOSIS — Z7409 Other reduced mobility: Secondary | ICD-10-CM | POA: Diagnosis not present

## 2024-03-15 DIAGNOSIS — M25661 Stiffness of right knee, not elsewhere classified: Secondary | ICD-10-CM | POA: Diagnosis not present

## 2024-03-15 DIAGNOSIS — M25561 Pain in right knee: Secondary | ICD-10-CM | POA: Diagnosis not present

## 2024-03-15 DIAGNOSIS — M25461 Effusion, right knee: Secondary | ICD-10-CM | POA: Diagnosis not present

## 2024-03-15 DIAGNOSIS — Z96651 Presence of right artificial knee joint: Secondary | ICD-10-CM | POA: Diagnosis not present

## 2024-03-19 DIAGNOSIS — M25461 Effusion, right knee: Secondary | ICD-10-CM | POA: Diagnosis not present

## 2024-03-19 DIAGNOSIS — M25561 Pain in right knee: Secondary | ICD-10-CM | POA: Diagnosis not present

## 2024-03-19 DIAGNOSIS — Z7409 Other reduced mobility: Secondary | ICD-10-CM | POA: Diagnosis not present

## 2024-03-19 DIAGNOSIS — M25661 Stiffness of right knee, not elsewhere classified: Secondary | ICD-10-CM | POA: Diagnosis not present

## 2024-03-19 DIAGNOSIS — Z96651 Presence of right artificial knee joint: Secondary | ICD-10-CM | POA: Diagnosis not present

## 2024-03-21 DIAGNOSIS — M25461 Effusion, right knee: Secondary | ICD-10-CM | POA: Diagnosis not present

## 2024-03-21 DIAGNOSIS — Z7409 Other reduced mobility: Secondary | ICD-10-CM | POA: Diagnosis not present

## 2024-03-21 DIAGNOSIS — M24661 Ankylosis, right knee: Secondary | ICD-10-CM | POA: Diagnosis not present

## 2024-03-21 DIAGNOSIS — Z96651 Presence of right artificial knee joint: Secondary | ICD-10-CM | POA: Diagnosis not present

## 2024-03-21 DIAGNOSIS — M25561 Pain in right knee: Secondary | ICD-10-CM | POA: Diagnosis not present

## 2024-03-27 DIAGNOSIS — M25561 Pain in right knee: Secondary | ICD-10-CM | POA: Diagnosis not present

## 2024-03-27 DIAGNOSIS — Z96651 Presence of right artificial knee joint: Secondary | ICD-10-CM | POA: Diagnosis not present

## 2024-03-27 DIAGNOSIS — Z7409 Other reduced mobility: Secondary | ICD-10-CM | POA: Diagnosis not present

## 2024-03-27 DIAGNOSIS — M25461 Effusion, right knee: Secondary | ICD-10-CM | POA: Diagnosis not present

## 2024-03-27 DIAGNOSIS — M25661 Stiffness of right knee, not elsewhere classified: Secondary | ICD-10-CM | POA: Diagnosis not present

## 2024-04-02 DIAGNOSIS — M24661 Ankylosis, right knee: Secondary | ICD-10-CM | POA: Diagnosis not present

## 2024-04-02 DIAGNOSIS — Z96651 Presence of right artificial knee joint: Secondary | ICD-10-CM | POA: Diagnosis not present

## 2024-04-06 DIAGNOSIS — Z96651 Presence of right artificial knee joint: Secondary | ICD-10-CM | POA: Diagnosis not present

## 2024-04-06 DIAGNOSIS — M24661 Ankylosis, right knee: Secondary | ICD-10-CM | POA: Diagnosis not present

## 2024-04-10 DIAGNOSIS — Z96651 Presence of right artificial knee joint: Secondary | ICD-10-CM | POA: Diagnosis not present

## 2024-04-10 DIAGNOSIS — M24661 Ankylosis, right knee: Secondary | ICD-10-CM | POA: Diagnosis not present

## 2024-04-13 DIAGNOSIS — M24661 Ankylosis, right knee: Secondary | ICD-10-CM | POA: Diagnosis not present

## 2024-04-13 DIAGNOSIS — Z7409 Other reduced mobility: Secondary | ICD-10-CM | POA: Diagnosis not present

## 2024-04-13 DIAGNOSIS — Z96651 Presence of right artificial knee joint: Secondary | ICD-10-CM | POA: Diagnosis not present

## 2024-05-09 DIAGNOSIS — J069 Acute upper respiratory infection, unspecified: Secondary | ICD-10-CM | POA: Diagnosis not present

## 2024-05-21 DIAGNOSIS — H34812 Central retinal vein occlusion, left eye, with macular edema: Secondary | ICD-10-CM | POA: Diagnosis not present

## 2024-05-21 DIAGNOSIS — H35033 Hypertensive retinopathy, bilateral: Secondary | ICD-10-CM | POA: Diagnosis not present

## 2024-05-21 DIAGNOSIS — H35361 Drusen (degenerative) of macula, right eye: Secondary | ICD-10-CM | POA: Diagnosis not present

## 2024-05-21 DIAGNOSIS — H2513 Age-related nuclear cataract, bilateral: Secondary | ICD-10-CM | POA: Diagnosis not present

## 2024-05-21 DIAGNOSIS — H43813 Vitreous degeneration, bilateral: Secondary | ICD-10-CM | POA: Diagnosis not present

## 2024-05-24 DIAGNOSIS — Z0000 Encounter for general adult medical examination without abnormal findings: Secondary | ICD-10-CM | POA: Diagnosis not present

## 2024-05-24 DIAGNOSIS — I25810 Atherosclerosis of coronary artery bypass graft(s) without angina pectoris: Secondary | ICD-10-CM | POA: Diagnosis not present

## 2024-05-24 DIAGNOSIS — N183 Chronic kidney disease, stage 3 unspecified: Secondary | ICD-10-CM | POA: Diagnosis not present

## 2024-05-24 DIAGNOSIS — R490 Dysphonia: Secondary | ICD-10-CM | POA: Diagnosis not present

## 2024-05-24 DIAGNOSIS — Z1331 Encounter for screening for depression: Secondary | ICD-10-CM | POA: Diagnosis not present

## 2024-08-27 DIAGNOSIS — H34812 Central retinal vein occlusion, left eye, with macular edema: Secondary | ICD-10-CM | POA: Diagnosis not present
